# Patient Record
Sex: Female | Born: 1985 | Race: Black or African American | Hispanic: No | Smoking: Never smoker
Health system: Southern US, Community
[De-identification: ages and names within clinical notes are randomized; demographics above are authoritative.]

## PROBLEM LIST (undated history)

## (undated) DIAGNOSIS — E669 Obesity, unspecified: Secondary | ICD-10-CM

## (undated) DIAGNOSIS — C44792 Other specified malignant neoplasm of skin of right lower limb, including hip: Secondary | ICD-10-CM

---
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
## 2006-12-15 ENCOUNTER — Emergency Department (HOSPITAL_COMMUNITY): Admission: EM | Admit: 2006-12-15 | Discharge: 2006-12-15 | Payer: Self-pay | Admitting: Family Medicine

## 2009-09-07 ENCOUNTER — Emergency Department (HOSPITAL_COMMUNITY): Admission: EM | Admit: 2009-09-07 | Discharge: 2009-09-07 | Payer: Self-pay | Admitting: Emergency Medicine

## 2009-12-25 ENCOUNTER — Inpatient Hospital Stay (HOSPITAL_COMMUNITY): Admission: AD | Admit: 2009-12-25 | Discharge: 2009-12-25 | Payer: Self-pay | Admitting: Obstetrics & Gynecology

## 2010-05-01 ENCOUNTER — Ambulatory Visit (HOSPITAL_COMMUNITY): Admission: RE | Admit: 2010-05-01 | Discharge: 2010-05-01 | Payer: Self-pay | Admitting: Obstetrics and Gynecology

## 2010-08-21 ENCOUNTER — Inpatient Hospital Stay (HOSPITAL_COMMUNITY): Admission: AD | Admit: 2010-08-21 | Discharge: 2010-08-25 | Payer: Self-pay | Admitting: Obstetrics and Gynecology

## 2010-08-24 ENCOUNTER — Encounter (INDEPENDENT_AMBULATORY_CARE_PROVIDER_SITE_OTHER): Payer: Self-pay | Admitting: Obstetrics and Gynecology

## 2010-11-29 ENCOUNTER — Encounter: Payer: Self-pay | Admitting: Obstetrics and Gynecology

## 2011-01-20 LAB — RH IMMUNE GLOB WKUP(>/=20WKS)(NOT WOMEN'S HOSP)
Antibody Screen: NEGATIVE
Fetal Screen: NEGATIVE
Unit division: 0

## 2011-01-20 LAB — CBC
HCT: 31.9 % — ABNORMAL LOW (ref 36.0–46.0)
Hemoglobin: 10.6 g/dL — ABNORMAL LOW (ref 12.0–15.0)
Platelets: 290 10*3/uL (ref 150–400)
RBC: 3.6 MIL/uL — ABNORMAL LOW (ref 3.87–5.11)
WBC: 12.8 10*3/uL — ABNORMAL HIGH (ref 4.0–10.5)

## 2011-01-21 LAB — CBC
MCH: 29.1 pg (ref 26.0–34.0)
RBC: 4.4 MIL/uL (ref 3.87–5.11)
RDW: 14.5 % (ref 11.5–15.5)

## 2011-01-21 LAB — RPR: RPR Ser Ql: NONREACTIVE

## 2011-01-28 LAB — URINALYSIS, ROUTINE W REFLEX MICROSCOPIC
Bilirubin Urine: NEGATIVE
Glucose, UA: NEGATIVE mg/dL
Ketones, ur: NEGATIVE mg/dL
Nitrite: NEGATIVE

## 2011-01-28 LAB — WET PREP, GENITAL: Trich, Wet Prep: NONE SEEN

## 2011-01-28 LAB — CBC
HCT: 39.5 % (ref 36.0–46.0)
Platelets: 336 10*3/uL (ref 150–400)
RDW: 13.3 % (ref 11.5–15.5)
WBC: 6.8 10*3/uL (ref 4.0–10.5)

## 2011-01-28 LAB — POCT PREGNANCY, URINE: Preg Test, Ur: POSITIVE

## 2011-01-28 LAB — HCG, QUANTITATIVE, PREGNANCY: hCG, Beta Chain, Quant, S: 16403 m[IU]/mL — ABNORMAL HIGH (ref <5)

## 2011-01-28 LAB — GC/CHLAMYDIA PROBE AMP, GENITAL: GC Probe Amp, Genital: NEGATIVE

## 2011-02-11 LAB — URINALYSIS, ROUTINE W REFLEX MICROSCOPIC
Bilirubin Urine: NEGATIVE
Glucose, UA: NEGATIVE mg/dL
Hgb urine dipstick: NEGATIVE
Ketones, ur: NEGATIVE mg/dL

## 2011-04-12 ENCOUNTER — Encounter: Payer: Self-pay | Admitting: Family Medicine

## 2011-04-12 ENCOUNTER — Ambulatory Visit (INDEPENDENT_AMBULATORY_CARE_PROVIDER_SITE_OTHER): Payer: Medicaid Other | Admitting: Family Medicine

## 2011-04-12 VITALS — BP 108/73 | HR 81 | Temp 99.2°F | Ht 62.75 in | Wt 231.4 lb

## 2011-04-12 DIAGNOSIS — E669 Obesity, unspecified: Secondary | ICD-10-CM | POA: Insufficient documentation

## 2011-04-12 DIAGNOSIS — Z309 Encounter for contraceptive management, unspecified: Secondary | ICD-10-CM

## 2011-04-12 DIAGNOSIS — F1210 Cannabis abuse, uncomplicated: Secondary | ICD-10-CM | POA: Insufficient documentation

## 2011-04-12 DIAGNOSIS — Z30431 Encounter for routine checking of intrauterine contraceptive device: Secondary | ICD-10-CM | POA: Insufficient documentation

## 2011-04-12 DIAGNOSIS — Z79899 Other long term (current) drug therapy: Secondary | ICD-10-CM

## 2011-04-12 DIAGNOSIS — N898 Other specified noninflammatory disorders of vagina: Secondary | ICD-10-CM

## 2011-04-12 LAB — POCT WET PREP (WET MOUNT): Clue Cells Wet Prep HPF POC: NEGATIVE

## 2011-04-12 NOTE — Patient Instructions (Signed)
It was nice to meet you.  I will send you a letter in the mail with the rest of your lab results.  As long as they are all normal, you can follow up next November for your well woman exam and pap smear.  If you have any questions or problems, please feel free to call the office at any time.

## 2011-04-12 NOTE — Assessment & Plan Note (Signed)
Patient aware she is overweight but not motivated to make changes at this time.  Will continue to follow.

## 2011-04-12 NOTE — Progress Notes (Signed)
  Subjective:    Patient ID: Gail Thomas, female    DOB: 1986/07/15, 25 y.o.   MRN: [id number]  HPI Pt is a new patient to our practice.  She presents to establish care.  She wants to check and make sure her Mirena is still in place as she has stopped having periods.  She had it placed about 6 months ago and has heard it can stop your periods, but she wants to make sure she is not pregnant.  She has also been having a lot of white discharge and wants to be checked for STD's.  She says it does not smell bad but it is more than normal discharge.  She uses condoms with intercourse.   Patient's last Pap was in 2011, so was her last flu shot and tetanus shot.  She has one son who was born in October 2011 by primary caesarian section for failure to dilate.   Patient admits to smoking Marijuana 2-4 x/month.  She understands that it is illegal and detrimental to her health, which is why she does not do it that often.   Patient states she is not currently concerned with her weight.    Review of Systems  Constitutional: Negative for fever.  HENT: Negative for rhinorrhea.   Eyes: Negative for visual disturbance.  Respiratory: Negative for shortness of breath.   Cardiovascular: Negative for chest pain.  Gastrointestinal: Negative for abdominal pain.  Genitourinary: Positive for menstrual problem.  Musculoskeletal: Negative for arthralgias.  Skin: Negative for rash.  Neurological: Negative for dizziness.       Objective:   Physical Exam  Vitals reviewed. Constitutional: She appears well-developed and well-nourished. No distress.  Genitourinary: Uterus normal. Pelvic exam was performed with patient supine. Cervix exhibits discharge. Right adnexum displays no mass, no tenderness and no fullness. Left adnexum displays no mass, no tenderness and no fullness.       Mirena strings visible coming out of cervical os.  Copious white discharge coming from os and present in vagina.   Otherwise normal  exam.           Assessment & Plan:  25 year old female with amenorrhea, likely secondary to Mirena 1) Check urine pregnancy, Negative.  2) Check wet prep, GC/Chlamydia 3) Continue to use condoms with intercourse 4) Follow up next fall for Pap and well woman exam.

## 2011-04-12 NOTE — Assessment & Plan Note (Signed)
Mirena in place, Urine pregnancy negative.  Advised pt may be amenorrheic with IUD.

## 2011-04-12 NOTE — Assessment & Plan Note (Signed)
Pt smokes Marijuana two to four times/month.  Advised that this is unhealthy for multiple reasons, and advised pt not care for her child under the influence.

## 2011-04-13 ENCOUNTER — Encounter: Payer: Self-pay | Admitting: Family Medicine

## 2011-04-13 ENCOUNTER — Telehealth: Payer: Self-pay | Admitting: Family Medicine

## 2011-04-13 LAB — GC/CHLAMYDIA PROBE AMP, GENITAL: Chlamydia, DNA Probe: NEGATIVE

## 2011-04-13 MED ORDER — FLUCONAZOLE 150 MG PO TABS: 150.0000 mg | ORAL_TABLET | Freq: Once | ORAL | Status: AC

## 2011-04-13 NOTE — Telephone Encounter (Signed)
Called Gail Thomas to notify her she has a yeast infection and that I am sending an Rx to her pharmacy for diflucan.  Also told her all other tests (GC/Chlamydia, BV/Trich) were negative.

## 2011-06-03 ENCOUNTER — Emergency Department (HOSPITAL_COMMUNITY)
Admission: EM | Admit: 2011-06-03 | Discharge: 2011-06-03 | Disposition: A | Discharge status: Home / Self Care | Payer: Medicaid Other | Attending: Emergency Medicine | Admitting: Emergency Medicine

## 2011-06-03 DIAGNOSIS — R059 Cough, unspecified: Secondary | ICD-10-CM | POA: Insufficient documentation

## 2011-06-03 DIAGNOSIS — J029 Acute pharyngitis, unspecified: Secondary | ICD-10-CM | POA: Insufficient documentation

## 2011-06-03 DIAGNOSIS — B9789 Other viral agents as the cause of diseases classified elsewhere: Secondary | ICD-10-CM | POA: Insufficient documentation

## 2011-06-03 DIAGNOSIS — R509 Fever, unspecified: Secondary | ICD-10-CM | POA: Insufficient documentation

## 2011-06-03 DIAGNOSIS — R05 Cough: Secondary | ICD-10-CM | POA: Insufficient documentation

## 2011-10-14 IMAGING — US US OB COMP LESS 14 WK
1 series · 14 of 28 positions shown · non-contrast
Comparison: none

CLINICAL DATA: Early pregnancy with cramping.  Quantitative beta
HCG of [DATE].

OBSTETRIC <14 WK US AND TRANSVAGINAL OB US
TECHNIQUE: Both transabdominal and transvaginal ultrasound
examinations were performed for complete evaluation of the
gestation as well as the maternal uterus, adnexal regions, and
pelvic cul-de-sac.

[Series 1: us ob comp less 14 wks · 0.17mm/px · 60 acquisitions, 14 frames shown]
[im 3/60]
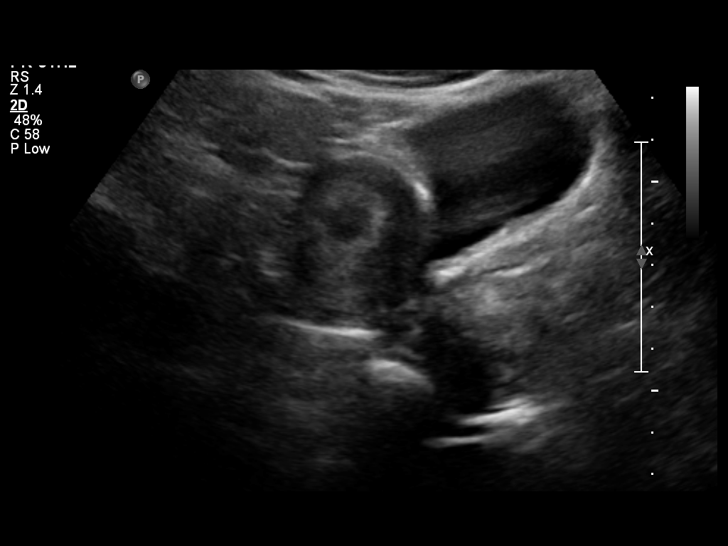
[im 7/60]
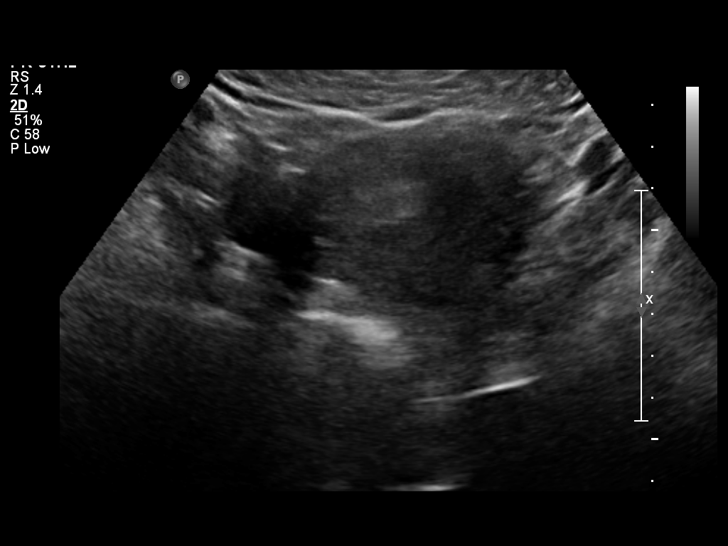
[im 11/60]
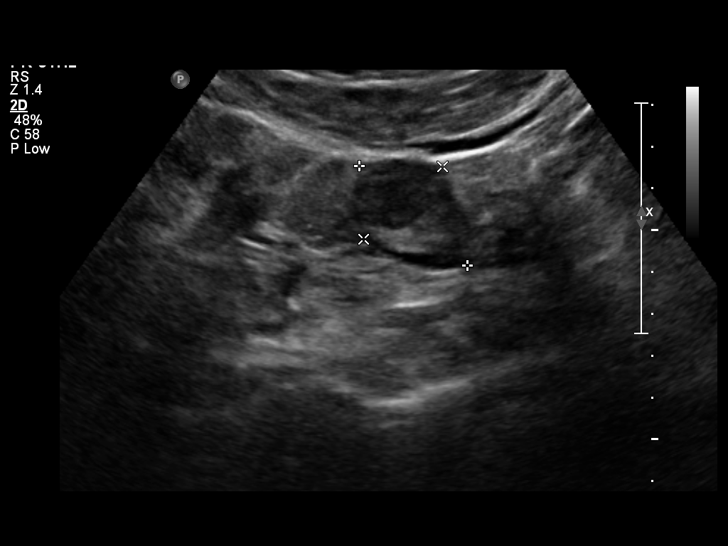
[im 16/60]
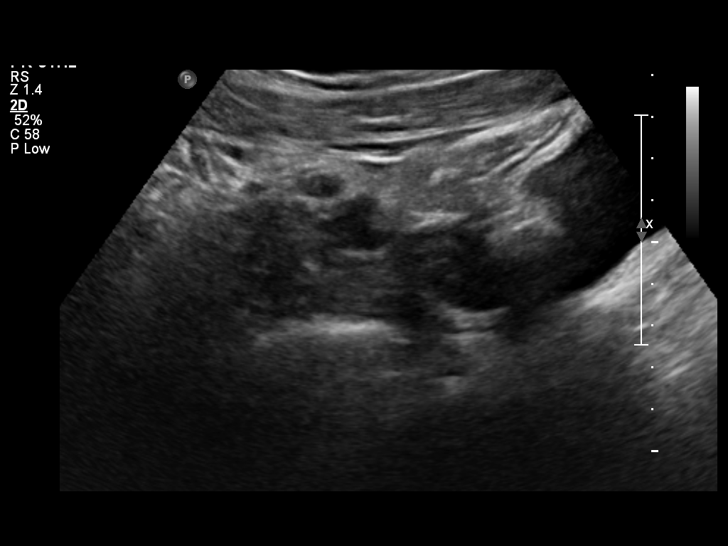
[im 20/60]
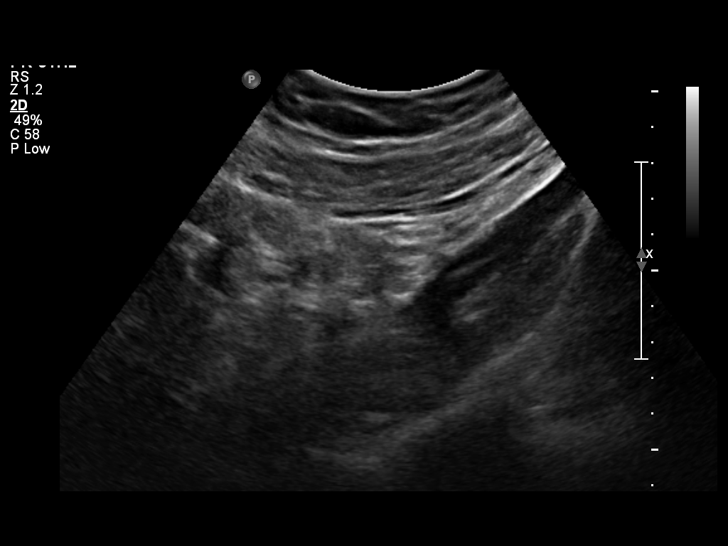
[im 25/60]
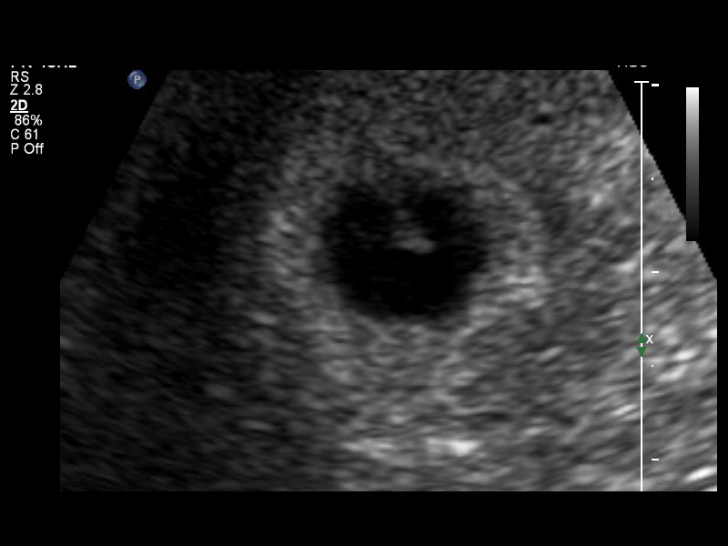
[im 29/60]
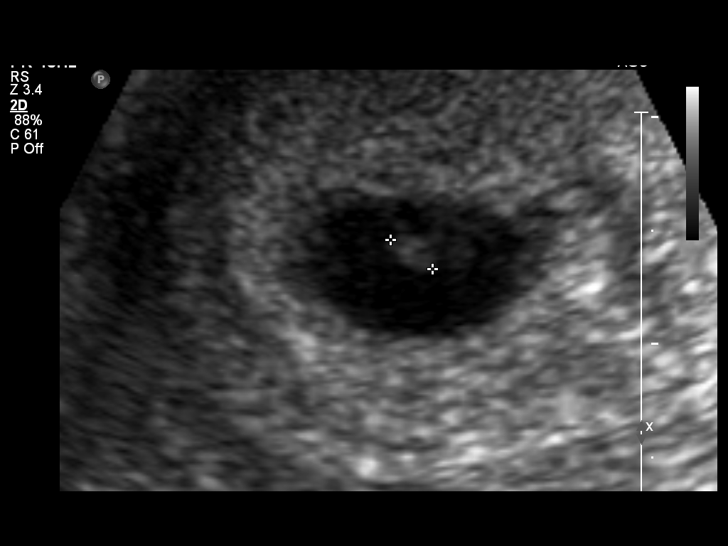
[im 33/60]
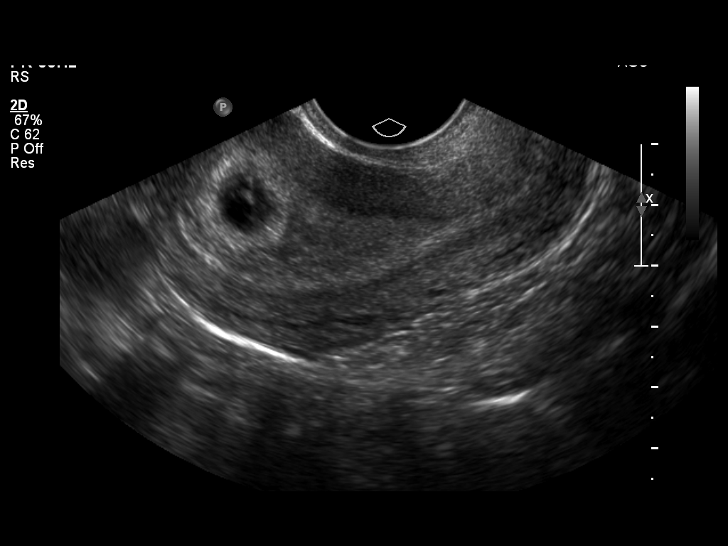
[im 38/60]
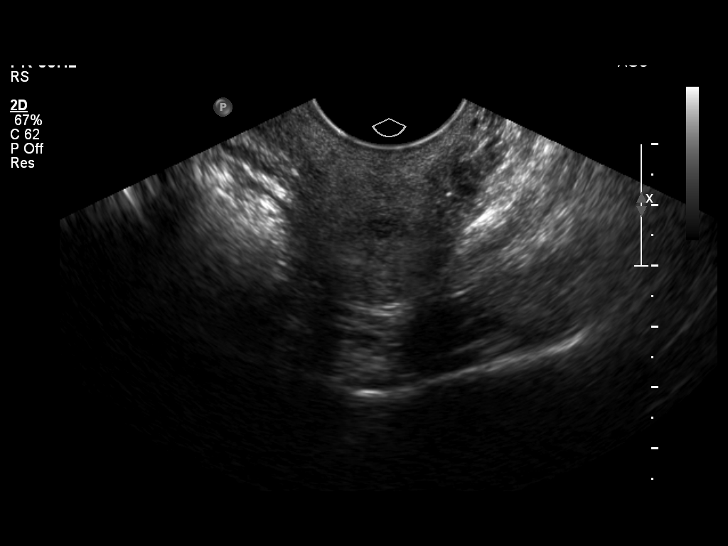
[im 42/60]
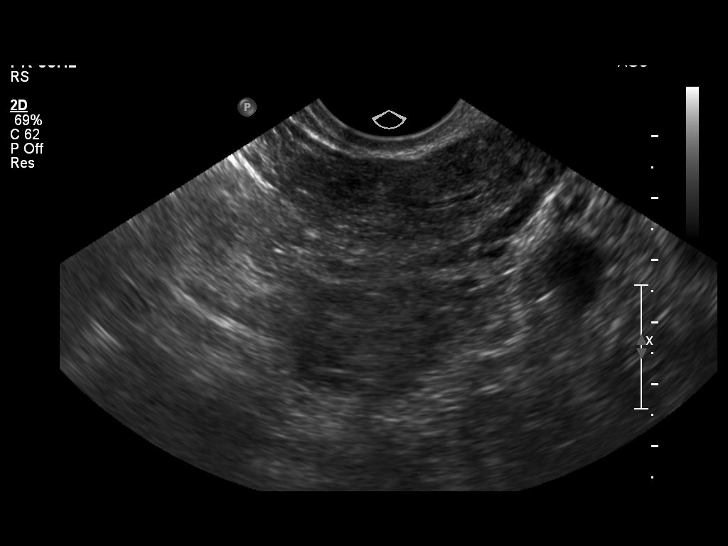
[im 46/60]
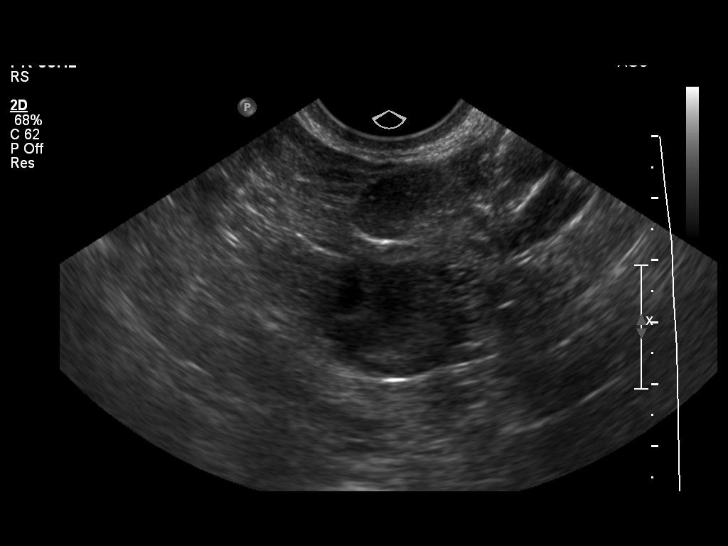
[im 51/60]
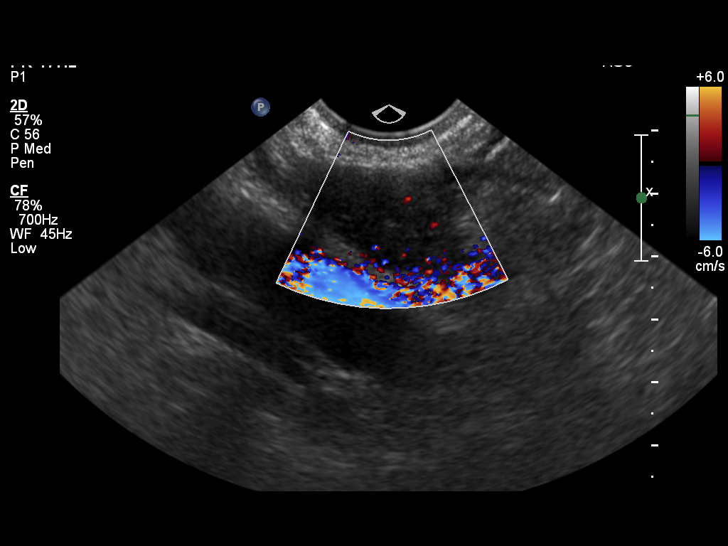
[im 55/60]
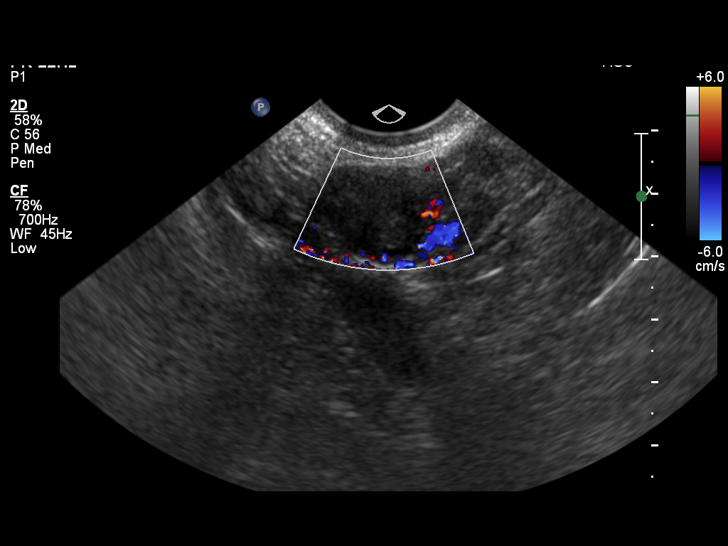
[im 60/60]
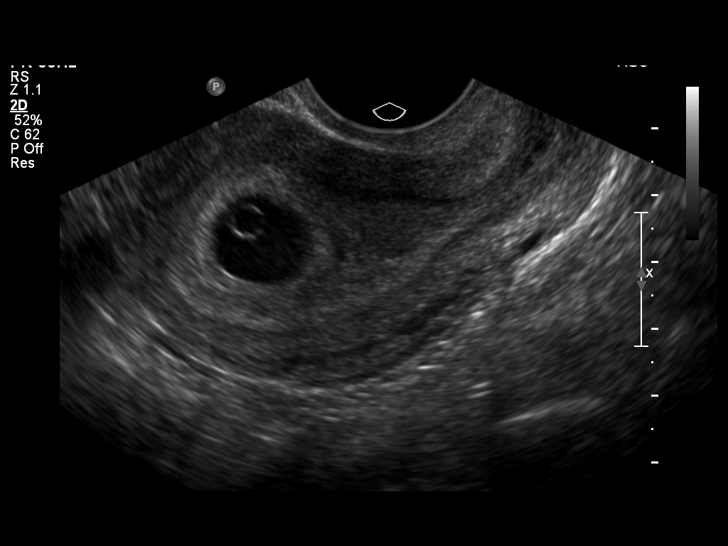

[14 of 28 positions shown; findings below may reference images not displayed]

FINDINGS: Intrauterine gestational sac noted with internal yolk sac
and fetal pole with cardiac activity at 102 beats per minute.
Crown-rump length is 0.24 cm compatible with 5 weeks 6 days
gestation.

No subchorionic hemorrhage identified.  The ovaries appear normal.
No free pelvic fluid identified.
IMPRESSION: 1.  Single living intrauterine pregnancy measuring at 5 weeks 6
days gestation, with embryonic cardiac activity at 102 beats per
minute.

## 2013-09-27 ENCOUNTER — Encounter: Payer: Self-pay | Admitting: Internal Medicine

## 2013-09-27 ENCOUNTER — Ambulatory Visit: Payer: Medicaid Other | Attending: Internal Medicine | Admitting: Internal Medicine

## 2013-09-27 VITALS — BP 120/80 | HR 79 | Temp 98.4°F | Ht 62.0 in | Wt 242.4 lb

## 2013-09-27 DIAGNOSIS — Z01419 Encounter for gynecological examination (general) (routine) without abnormal findings: Secondary | ICD-10-CM | POA: Insufficient documentation

## 2013-09-27 DIAGNOSIS — Z124 Encounter for screening for malignant neoplasm of cervix: Secondary | ICD-10-CM

## 2013-09-27 DIAGNOSIS — Z0000 Encounter for general adult medical examination without abnormal findings: Secondary | ICD-10-CM | POA: Insufficient documentation

## 2013-09-27 NOTE — Progress Notes (Signed)
Patient ID: Gail Thomas, female   DOB: 09/30/1986, 27 y.o.   MRN: [id number] Patient Demographics  Gail Thomas, is a 27 y.o. female  NWG:[id number]  [id number]:[id number]  DOB - June 26, 1986  CC:  Chief Complaint  Patient presents with  . new patient    exam for cervical cancer       HPI: Gail Thomas is a 27 y.o. female here today to establish medical care. Patient started college for [employer], needs tetanus and flu shot, TB test and varicella titer. She has no complaint, she also wants to have a Pap smear done. She has had abnormal Pap before 3 years ago, status post colposcopy. She smokes marijuana daily, drinks alcohol occasionally. Patient has No headache, No chest pain, No abdominal pain - No Nausea, No new weakness tingling or numbness, No Cough - SOB.  No Known Allergies History reviewed. No pertinent past medical history. Current Outpatient Prescriptions on File Prior to Visit  Medication Sig Dispense Refill  . levonorgestrel (MIRENA) 20 MCG/24HR IUD 1 each by Intrauterine route once.         No current facility-administered medications on file prior to visit.   Family History  Problem Relation Age of Onset  . Diabetes Maternal Grandmother   . Cancer Maternal Grandmother    History   Social History  . Marital Status: Single    Spouse Name: N/A    Number of Children: N/A  . Years of Education: N/A   Occupational History  . Not on file.   Social History Main Topics  . Smoking status: Current Some Day Smoker    Types: Cigarettes  . Smokeless tobacco: Never Used  . Alcohol Use: Not on file  . Drug Use: 1.00 per week    Special: Marijuana  . Sexual Activity: Yes    Birth Control/ Protection: IUD, Condom   Other Topics Concern  . Not on file   Social History Narrative  . No narrative on file    Review of Systems: Constitutional: Negative for fever, chills, diaphoresis, activity change, appetite change and fatigue. HENT: Negative for ear pain, nosebleeds,  congestion, facial swelling, rhinorrhea, neck pain, neck stiffness and ear discharge.  Eyes: Negative for pain, discharge, redness, itching and visual disturbance. Respiratory: Negative for cough, choking, chest tightness, shortness of breath, wheezing and stridor.  Cardiovascular: Negative for chest pain, palpitations and leg swelling. Gastrointestinal: Negative for abdominal distention. Genitourinary: Negative for dysuria, urgency, frequency, hematuria, flank pain, decreased urine volume, difficulty urinating and dyspareunia.  Musculoskeletal: Negative for back pain, joint swelling, arthralgia and gait problem. Neurological: Negative for dizziness, tremors, seizures, syncope, facial asymmetry, speech difficulty, weakness, light-headedness, numbness and headaches.  Hematological: Negative for adenopathy. Does not bruise/bleed easily. Psychiatric/Behavioral: Negative for hallucinations, behavioral problems, confusion, dysphoric mood, decreased concentration and agitation.    Objective:   Filed Vitals:   09/27/13 1513  BP: 120/80  Pulse: 79  Temp: 98.4 F (36.9 C)    Physical Exam: Constitutional: Patient appears well-developed and well-nourished. No distress. Morbidly obese HENT: Normocephalic, atraumatic, External right and left ear normal. Oropharynx is clear and moist.  Eyes: Conjunctivae and EOM are normal. PERRLA, no scleral icterus. Neck: Normal ROM. Neck supple. No JVD. No tracheal deviation. No thyromegaly. CVS: RRR, S1/S2 +, no murmurs, no gallops, no carotid bruit.  Pulmonary: Effort and breath sounds normal, no stridor, rhonchi, wheezes, rales.  Abdominal: Soft. BS +, no distension, tenderness, rebound or guarding.  Musculoskeletal: Normal range of motion. No edema and no tenderness.  Lymphadenopathy: No lymphadenopathy noted, cervical, inguinal or axillary Neuro: Alert. Normal reflexes, muscle tone coordination. No cranial nerve deficit. Skin: Skin is warm and dry. No  rash noted. Not diaphoretic. No erythema. No pallor. Psychiatric: Normal mood and affect. Behavior, judgment, thought content normal. Pelvic exam: Normal female external genitalia, copious whitish/milky discharge, central cervix, IUD thread in situ. Negative cervical excitation tenderness Lab Results  Component Value Date   WBC 12.8* 08/24/2010   HGB 10.6* 08/24/2010   HCT 31.9* 08/24/2010   MCV 88.6 08/24/2010   PLT 290 08/24/2010   No results found for this basename: CREATININE, BUN, NA, K, CL, CO2    No results found for this basename: HGBA1C   Lipid Panel  No results found for this basename: chol, trig, hdl, cholhdl, vldl, ldlcalc       Assessment and plan:   1. Annual physical exam Patient needs to have serum Varicella titer for school-   Varicella zoster antibody, IgG  2. Pap smear for cervical cancer screening - Cytology - PAP [place] - Cervicovaginal ancillary only  Patient extensively counseled about nutrition and exercise Patient counseled about smoking cessation  Follow up in 6 months or when necessary   The patient was given clear instructions to go to ER or return to medical center if symptoms don't improve, worsen or new problems develop. The patient verbalized understanding. The patient was told to call to get lab results if they haven't heard anything in the next week.     Jeanann Lewandowsky, MD, MHA, FACP, FAAP [facility] [place], [place] [phone]   09/27/2013, 3:52 PM

## 2013-09-27 NOTE — Patient Instructions (Signed)
Exercise to Lose Weight Exercise and a healthy diet may help you lose weight. Your doctor may suggest specific exercises. EXERCISE IDEAS AND TIPS  Choose low-cost things you enjoy doing, such as walking, bicycling, or exercising to workout videos.  Take stairs instead of the elevator.  Walk during your lunch break.  Park your car further away from work or school.  Go to a gym or an exercise class.  Start with 5 to 10 minutes of exercise each day. Build up to 30 minutes of exercise 4 to 6 days a week.  Wear shoes with good support and comfortable clothes.  Stretch before and after working out.  Work out until you breathe harder and your heart beats faster.  Drink extra water when you exercise.  Do not do so much that you hurt yourself, feel dizzy, or get very short of breath. Exercises that burn about 150 calories:  Running 1  miles in 15 minutes.  Playing volleyball for 45 to 60 minutes.  Washing and waxing a car for 45 to 60 minutes.  Playing touch football for 45 minutes.  Walking 1  miles in 35 minutes.  Pushing a stroller 1  miles in 30 minutes.  Playing basketball for 30 minutes.  Raking leaves for 30 minutes.  Bicycling 5 miles in 30 minutes.  Walking 2 miles in 30 minutes.  Dancing for 30 minutes.  Shoveling snow for 15 minutes.  Swimming laps for 20 minutes.  Walking up stairs for 15 minutes.  Bicycling 4 miles in 15 minutes.  Gardening for 30 to 45 minutes.  Jumping rope for 15 minutes.  Washing windows or floors for 45 to 60 minutes. Document Released: 11/27/2010 Document Revised: 01/17/2012 Document Reviewed: 11/27/2010 Exit[employer] Patient Information 2014 [employer]. Calorie Counting Diet A calorie counting diet requires you to eat the number of calories that are right for you in a day. Calories are the measurement of how much energy you get from the food you eat. Eating the right amount of calories is important for staying at a  healthy weight. If you eat too many calories, your body will store them as fat and you may gain weight. If you eat too few calories, you may lose weight. Counting the number of calories you eat during a day will help you know if you are eating the right amount. A Registered Dietitian can determine how many calories you need in a day. The amount of calories needed varies from person to person. If your goal is to lose weight, you will need to eat fewer calories. Losing weight can benefit you if you are overweight or have health problems such as heart disease, high blood pressure, or diabetes. If your goal is to gain weight, you will need to eat more calories. Gaining weight may be necessary if you have a certain health problem that causes your body to need more energy. TIPS Whether you are increasing or decreasing the number of calories you eat during a day, it may be hard to get used to changes in what you eat and drink. The following are tips to help you keep track of the number of calories you eat.  Measure foods at home with measuring cups. This helps you know the amount of food and number of calories you are eating.  Restaurants often serve food in amounts that are larger than 1 serving. While eating out, estimate how many servings of a food you are given. For example, a serving of cooked rice   is  cup or about the size of half of a fist. Knowing serving sizes will help you be aware of how much food you are eating at restaurants.  Ask for smaller portion sizes or child-size portions at restaurants.  Plan to eat half of a meal at a restaurant. Take the rest home or share the other half with a friend.  Read the Nutrition Facts panel on food labels for calorie content and serving size. You can find out how many servings are in a package, the size of a serving, and the number of calories each serving has.  For example, a package might contain 3 cookies. The Nutrition Facts panel on that package says  that 1 serving is 1 cookie. Below that, it will say there are 3 servings in the container. The calories section of the Nutrition Facts label says there are 90 calories. This means there are 90 calories in 1 cookie (1 serving). If you eat 1 cookie you have eaten 90 calories. If you eat all 3 cookies, you have eaten 270 calories (3 servings x 90 calories = 270 calories). The list below tells you how big or small some common portion sizes are.  1 oz.........4 stacked dice.  3 oz.........Deck of cards.  1 tsp........Tip of little finger.  1 tbs........Thumb.  2 tbs........Golf ball.   cup.......Half of a fist.  1 cup........A fist. KEEP A FOOD LOG Write down every food item you eat, the amount you eat, and the number of calories in each food you eat during the day. At the end of the day, you can add up the total number of calories you have eaten. It may help to keep a list like the one below. Find out the calorie information by reading the Nutrition Facts panel on food labels. Breakfast  Bran cereal (1 cup, 110 calories).  Fat-free milk ( cup, 45 calories). Snack  Apple (1 medium, 80 calories). Lunch  Spinach (1 cup, 20 calories).  Tomato ( medium, 20 calories).  Chicken breast strips (3 oz, 165 calories).  Shredded cheddar cheese ( cup, 110 calories).  Light Italian dressing (2 tbs, 60 calories).  Whole-wheat bread (1 slice, 80 calories).  Tub margarine (1 tsp, 35 calories).  Vegetable soup (1 cup, 160 calories). Dinner  Pork chop (3 oz, 190 calories).  Brown rice (1 cup, 215 calories).  Steamed broccoli ( cup, 20 calories).  Strawberries (1  cup, 65 calories).  Whipped cream (1 tbs, 50 calories). Daily Calorie Total: 1425 Document Released: 10/25/2005 Document Revised: 01/17/2012 Document Reviewed: 04/21/2007 Exit[employer] Patient Information 2014 [employer].  

## 2013-09-27 NOTE — Progress Notes (Signed)
Pt is her to establish care. Pt request an exam for cervical cancer; also has a list of shots requested for school.

## 2013-09-28 LAB — VARICELLA ZOSTER ANTIBODY, IGG: Varicella IgG: 206.1 Index — ABNORMAL HIGH (ref <135.00)

## 2015-09-16 ENCOUNTER — Other Ambulatory Visit: Payer: Self-pay | Admitting: Obstetrics and Gynecology

## 2015-09-17 LAB — CYTOLOGY - PAP

## 2015-10-21 DIAGNOSIS — D2370 Other benign neoplasm of skin of unspecified lower limb, including hip: Secondary | ICD-10-CM | POA: Insufficient documentation

## 2015-12-02 DIAGNOSIS — C44701 Unspecified malignant neoplasm of skin of unspecified lower limb, including hip: Secondary | ICD-10-CM | POA: Insufficient documentation

## 2015-12-31 ENCOUNTER — Encounter (HOSPITAL_BASED_OUTPATIENT_CLINIC_OR_DEPARTMENT_OTHER): Payer: Self-pay | Admitting: *Deleted

## 2016-01-03 ENCOUNTER — Other Ambulatory Visit: Payer: Self-pay | Admitting: Plastic Surgery

## 2016-01-03 DIAGNOSIS — C4499 Other specified malignant neoplasm of skin, unspecified: Secondary | ICD-10-CM

## 2016-01-07 ENCOUNTER — Encounter (HOSPITAL_BASED_OUTPATIENT_CLINIC_OR_DEPARTMENT_OTHER)
Admission: RE | Disposition: A | Discharge status: Home / Self Care | Payer: Self-pay | Source: Ambulatory Visit | Attending: Plastic Surgery

## 2016-01-07 ENCOUNTER — Encounter (HOSPITAL_BASED_OUTPATIENT_CLINIC_OR_DEPARTMENT_OTHER): Payer: Self-pay | Admitting: *Deleted

## 2016-01-07 ENCOUNTER — Ambulatory Visit (HOSPITAL_BASED_OUTPATIENT_CLINIC_OR_DEPARTMENT_OTHER): Payer: 59 | Admitting: Anesthesiology

## 2016-01-07 ENCOUNTER — Ambulatory Visit (HOSPITAL_BASED_OUTPATIENT_CLINIC_OR_DEPARTMENT_OTHER)
Admission: RE | Admit: 2016-01-07 | Discharge: 2016-01-07 | Disposition: A | Discharge status: Home / Self Care | Payer: 59 | Source: Ambulatory Visit | Attending: Plastic Surgery | Admitting: Plastic Surgery

## 2016-01-07 DIAGNOSIS — Z87891 Personal history of nicotine dependence: Secondary | ICD-10-CM | POA: Diagnosis not present

## 2016-01-07 DIAGNOSIS — C44792 Other specified malignant neoplasm of skin of right lower limb, including hip: Secondary | ICD-10-CM | POA: Diagnosis present

## 2016-01-07 DIAGNOSIS — Z6841 Body Mass Index (BMI) 40.0 and over, adult: Secondary | ICD-10-CM | POA: Insufficient documentation

## 2016-01-07 DIAGNOSIS — C4499 Other specified malignant neoplasm of skin, unspecified: Secondary | ICD-10-CM

## 2016-01-07 HISTORY — DX: Other specified malignant neoplasm of skin of right lower limb, including hip: C44.792

## 2016-01-07 HISTORY — DX: Obesity, unspecified: E66.9

## 2016-01-07 HISTORY — PX: LESION REMOVAL: SHX5196

## 2016-01-07 SURGERY — WIDE EXCISION, LESION, UPPER EXTREMITY
Anesthesia: General | Laterality: Right

## 2016-01-07 MED ORDER — SUCCINYLCHOLINE CHLORIDE 20 MG/ML IJ SOLN
INTRAMUSCULAR | Status: AC
  Filled 2016-01-07: qty 1

## 2016-01-07 MED ORDER — FENTANYL CITRATE (PF) 100 MCG/2ML IJ SOLN
INTRAMUSCULAR | Status: AC
  Filled 2016-01-07: qty 2

## 2016-01-07 MED ORDER — FENTANYL CITRATE (PF) 100 MCG/2ML IJ SOLN: 25.0000 ug | INTRAMUSCULAR | Status: DC | PRN

## 2016-01-07 MED ORDER — ONDANSETRON HCL 4 MG/2ML IJ SOLN
INTRAMUSCULAR | Status: AC
  Filled 2016-01-07: qty 2

## 2016-01-07 MED ORDER — CEFAZOLIN SODIUM-DEXTROSE 2-3 GM-% IV SOLR
2.0000 g | INTRAVENOUS | Status: AC
  Administered 2016-01-07: 2 g via INTRAVENOUS

## 2016-01-07 MED ORDER — SODIUM CHLORIDE 0.9 % IJ SOLN
INTRAMUSCULAR | Status: AC
  Filled 2016-01-07: qty 20

## 2016-01-07 MED ORDER — DEXAMETHASONE SODIUM PHOSPHATE 10 MG/ML IJ SOLN
INTRAMUSCULAR | Status: AC
  Filled 2016-01-07: qty 1

## 2016-01-07 MED ORDER — PROMETHAZINE HCL 25 MG/ML IJ SOLN: 6.2500 mg | INTRAMUSCULAR | Status: DC | PRN

## 2016-01-07 MED ORDER — ONDANSETRON HCL 4 MG/2ML IJ SOLN
INTRAMUSCULAR | Status: AC
Start: 2016-01-07 — End: 2016-01-07
  Filled 2016-01-07: qty 2

## 2016-01-07 MED ORDER — EPHEDRINE SULFATE 50 MG/ML IJ SOLN
INTRAMUSCULAR | Status: AC
  Filled 2016-01-07: qty 1

## 2016-01-07 MED ORDER — LIDOCAINE HCL (CARDIAC) 20 MG/ML IV SOLN
INTRAVENOUS | Status: AC
  Filled 2016-01-07: qty 5

## 2016-01-07 MED ORDER — BUPIVACAINE-EPINEPHRINE 0.25% -1:200000 IJ SOLN
INTRAMUSCULAR | Status: DC | PRN
  Administered 2016-01-07: 20 mL

## 2016-01-07 MED ORDER — LIDOCAINE HCL (CARDIAC) 20 MG/ML IV SOLN
INTRAVENOUS | Status: DC | PRN
  Administered 2016-01-07: 50 mg via INTRAVENOUS

## 2016-01-07 MED ORDER — CEFAZOLIN SODIUM-DEXTROSE 2-3 GM-% IV SOLR
INTRAVENOUS | Status: AC
  Filled 2016-01-07: qty 50

## 2016-01-07 MED ORDER — MIDAZOLAM HCL 2 MG/2ML IJ SOLN
1.0000 mg | INTRAMUSCULAR | Status: DC | PRN
  Administered 2016-01-07: 2 mg via INTRAVENOUS

## 2016-01-07 MED ORDER — DEXAMETHASONE SODIUM PHOSPHATE 4 MG/ML IJ SOLN
INTRAMUSCULAR | Status: DC | PRN
  Administered 2016-01-07: 10 mg via INTRAVENOUS

## 2016-01-07 MED ORDER — FENTANYL CITRATE (PF) 100 MCG/2ML IJ SOLN
50.0000 ug | INTRAMUSCULAR | Status: DC | PRN
  Administered 2016-01-07: 100 ug via INTRAVENOUS

## 2016-01-07 MED ORDER — ONDANSETRON HCL 4 MG/2ML IJ SOLN
INTRAMUSCULAR | Status: DC | PRN
  Administered 2016-01-07: 4 mg via INTRAVENOUS

## 2016-01-07 MED ORDER — BUPIVACAINE HCL (PF) 0.25 % IJ SOLN: INTRAMUSCULAR | Status: DC | PRN

## 2016-01-07 MED ORDER — SUFENTANIL CITRATE 50 MCG/ML IV SOLN
INTRAVENOUS | Status: AC
  Filled 2016-01-07: qty 1

## 2016-01-07 MED ORDER — BACITRACIN-NEOMYCIN-POLYMYXIN 400-5-5000 EX OINT
TOPICAL_OINTMENT | CUTANEOUS | Status: AC
  Filled 2016-01-07: qty 1

## 2016-01-07 MED ORDER — PROPOFOL 10 MG/ML IV BOLUS
INTRAVENOUS | Status: DC | PRN
  Administered 2016-01-07: 200 mg via INTRAVENOUS

## 2016-01-07 MED ORDER — SCOPOLAMINE 1 MG/3DAYS TD PT72: 1.0000 | MEDICATED_PATCH | Freq: Once | TRANSDERMAL | Status: DC | PRN

## 2016-01-07 MED ORDER — GLYCOPYRROLATE 0.2 MG/ML IJ SOLN: 0.2000 mg | Freq: Once | INTRAMUSCULAR | Status: DC | PRN

## 2016-01-07 MED ORDER — PROPOFOL 10 MG/ML IV BOLUS
INTRAVENOUS | Status: AC
  Filled 2016-01-07: qty 20

## 2016-01-07 MED ORDER — LACTATED RINGERS IV SOLN
INTRAVENOUS | Status: DC
  Administered 2016-01-07: 12:00:00 via INTRAVENOUS

## 2016-01-07 MED ORDER — SUCCINYLCHOLINE CHLORIDE 20 MG/ML IJ SOLN
INTRAMUSCULAR | Status: DC | PRN
  Administered 2016-01-07: 100 mg via INTRAVENOUS

## 2016-01-07 MED ORDER — SODIUM CHLORIDE 0.9 % IJ SOLN
INTRAMUSCULAR | Status: DC | PRN
  Administered 2016-01-07: 20 mL via INTRAVENOUS

## 2016-01-07 MED ORDER — MIDAZOLAM HCL 2 MG/2ML IJ SOLN
INTRAMUSCULAR | Status: AC
  Filled 2016-01-07: qty 2

## 2016-01-07 MED ORDER — SODIUM CHLORIDE 0.9 % IN NEBU
INHALATION_SOLUTION | RESPIRATORY_TRACT | Status: AC
  Filled 2016-01-07: qty 3

## 2016-01-07 SURGICAL SUPPLY — 74 items
BAG DECANTER FOR FLEXI CONT (MISCELLANEOUS) ×3 IMPLANT
BANDAGE ACE 3X5.8 VEL STRL LF (GAUZE/BANDAGES/DRESSINGS) IMPLANT
BANDAGE ACE 4X5 VEL STRL LF (GAUZE/BANDAGES/DRESSINGS) ×3 IMPLANT
BANDAGE ACE 6X5 VEL STRL LF (GAUZE/BANDAGES/DRESSINGS) IMPLANT
BLADE HEX COATED 2.75 (ELECTRODE) ×3 IMPLANT
BLADE SURG 10 STRL SS (BLADE) ×3 IMPLANT
BLADE SURG 15 STRL LF DISP TIS (BLADE) IMPLANT
BLADE SURG 15 STRL SS (BLADE)
BNDG COHESIVE 4X5 TAN STRL (GAUZE/BANDAGES/DRESSINGS) IMPLANT
BNDG CONFORM 2 STRL LF (GAUZE/BANDAGES/DRESSINGS) IMPLANT
BNDG CONFORM 3 STRL LF (GAUZE/BANDAGES/DRESSINGS) IMPLANT
BNDG GAUZE ELAST 4 BULKY (GAUZE/BANDAGES/DRESSINGS) ×3 IMPLANT
CANISTER SUCT 1200ML W/VALVE (MISCELLANEOUS) ×3 IMPLANT
CHLORAPREP W/TINT 26ML (MISCELLANEOUS) IMPLANT
CLOSURE WOUND 1/2 X4 (GAUZE/BANDAGES/DRESSINGS)
COVER BACK TABLE 60X90IN (DRAPES) ×3 IMPLANT
DECANTER SPIKE VIAL GLASS SM (MISCELLANEOUS) IMPLANT
DERMABOND ADVANCED (GAUZE/BANDAGES/DRESSINGS)
DERMABOND ADVANCED .7 DNX12 (GAUZE/BANDAGES/DRESSINGS) IMPLANT
DRAPE INCISE IOBAN 66X45 STRL (DRAPES) IMPLANT
DRAPE U-SHAPE 76X120 STRL (DRAPES) ×3 IMPLANT
DRESSING DUODERM 4X4 STERILE (GAUZE/BANDAGES/DRESSINGS) ×3 IMPLANT
DRSG ADAPTIC 3X8 NADH LF (GAUZE/BANDAGES/DRESSINGS) IMPLANT
DRSG EMULSION OIL 3X3 NADH (GAUZE/BANDAGES/DRESSINGS) IMPLANT
DRSG PAD ABDOMINAL 8X10 ST (GAUZE/BANDAGES/DRESSINGS) IMPLANT
ELECT REM PT RETURN 9FT ADLT (ELECTROSURGICAL) ×3
ELECTRODE REM PT RTRN 9FT ADLT (ELECTROSURGICAL) ×1 IMPLANT
GAUZE SPONGE 4X4 12PLY STRL (GAUZE/BANDAGES/DRESSINGS) ×3 IMPLANT
GAUZE XEROFORM 1X8 LF (GAUZE/BANDAGES/DRESSINGS) ×3 IMPLANT
GAUZE XEROFORM 5X9 LF (GAUZE/BANDAGES/DRESSINGS) IMPLANT
GLOVE BIO SURGEON STRL SZ 6.5 (GLOVE) ×6 IMPLANT
GLOVE BIO SURGEON STRL SZ8 (GLOVE) IMPLANT
GLOVE BIO SURGEONS STRL SZ 6.5 (GLOVE) ×3
GLOVE BIOGEL PI IND STRL 7.0 (GLOVE) ×2 IMPLANT
GLOVE BIOGEL PI INDICATOR 7.0 (GLOVE) ×4
GLOVE ECLIPSE 6.5 STRL STRAW (GLOVE) ×3 IMPLANT
GOWN STRL REUS W/ TWL LRG LVL3 (GOWN DISPOSABLE) ×3 IMPLANT
GOWN STRL REUS W/TWL LRG LVL3 (GOWN DISPOSABLE) ×6
MANIFOLD NEPTUNE II (INSTRUMENTS) IMPLANT
NEEDLE HYPO 25X1 1.5 SAFETY (NEEDLE) ×3 IMPLANT
NS IRRIG 1000ML POUR BTL (IV SOLUTION) IMPLANT
PACK BASIN DAY SURGERY FS (CUSTOM PROCEDURE TRAY) ×3 IMPLANT
PADDING CAST ABS 3INX4YD NS (CAST SUPPLIES)
PADDING CAST ABS 4INX4YD NS (CAST SUPPLIES)
PADDING CAST ABS COTTON 3X4 (CAST SUPPLIES) IMPLANT
PADDING CAST ABS COTTON 4X4 ST (CAST SUPPLIES) IMPLANT
PENCIL BUTTON HOLSTER BLD 10FT (ELECTRODE) ×3 IMPLANT
SHEET MEDIUM DRAPE 40X70 STRL (DRAPES) ×3 IMPLANT
SLEEVE SCD COMPRESS KNEE MED (MISCELLANEOUS) IMPLANT
SPLINT FIBERGLASS 3X35 (CAST SUPPLIES) IMPLANT
SPLINT FIBERGLASS 4X30 (CAST SUPPLIES) IMPLANT
SPLINT PLASTER CAST XFAST 3X15 (CAST SUPPLIES) IMPLANT
SPLINT PLASTER XTRA FASTSET 3X (CAST SUPPLIES)
SPONGE GAUZE 4X4 12PLY STER LF (GAUZE/BANDAGES/DRESSINGS) IMPLANT
SPONGE LAP 18X18 X RAY DECT (DISPOSABLE) ×3 IMPLANT
STAPLER VISISTAT 35W (STAPLE) IMPLANT
STOCKINETTE IMPERVIOUS LG (DRAPES) IMPLANT
STRIP CLOSURE SKIN 1/2X4 (GAUZE/BANDAGES/DRESSINGS) IMPLANT
SURGILUBE 2OZ TUBE FLIPTOP (MISCELLANEOUS) IMPLANT
SUT MNCRL AB 3-0 PS2 18 (SUTURE) ×6 IMPLANT
SUT MON AB 5-0 PS2 18 (SUTURE) ×6 IMPLANT
SUT SILK 3 0 PS 1 (SUTURE) IMPLANT
SUT SILK 4 0 PS 2 (SUTURE) ×3 IMPLANT
SUT VIC AB 5-0 PS2 18 (SUTURE) ×3 IMPLANT
SYR BULB IRRIGATION 50ML (SYRINGE) ×3 IMPLANT
SYR CONTROL 10ML LL (SYRINGE) IMPLANT
TAPE HYPAFIX 6 X30' (GAUZE/BANDAGES/DRESSINGS)
TAPE HYPAFIX 6X30 (GAUZE/BANDAGES/DRESSINGS) IMPLANT
TOWEL OR 17X24 6PK STRL BLUE (TOWEL DISPOSABLE) ×6 IMPLANT
TRAY DSU PREP LF (CUSTOM PROCEDURE TRAY) ×3 IMPLANT
TUBE CONNECTING 20'X1/4 (TUBING) ×1
TUBE CONNECTING 20X1/4 (TUBING) ×2 IMPLANT
UNDERPAD 30X30 (UNDERPADS AND DIAPERS) ×3 IMPLANT
YANKAUER SUCT BULB TIP NO VENT (SUCTIONS) ×3 IMPLANT

## 2016-01-07 NOTE — Transfer of Care (Signed)
Immediate Anesthesia Transfer of Care Note  Patient: Gail Thomas  Procedure(s) Performed: Procedure(s): RE EXCISION OF RIGHT LEG DERMATOFIBROSARCOMA  (Right)  Patient Location: PACU  Anesthesia Type:General  Level of Consciousness: awake  Airway & Oxygen Therapy: Patient Spontanous Breathing and Patient connected to face mask oxygen  Post-op Assessment: Report given to RN and Post -op Vital signs reviewed and stable  Post vital signs: Reviewed and stable  Last Vitals:  Filed Vitals:   01/07/16 1156  BP: 109/55  Pulse: 64  Temp: 36.7 C  Resp: 16    Complications: No apparent anesthesia complications

## 2016-01-07 NOTE — Brief Op Note (Signed)
01/07/2016  3:11 PM  PATIENT:  Gail Thomas  30 y.o. female  PRE-OPERATIVE DIAGNOSIS:  RIGHT LEG DERMATOFIBROSARCOMA   POST-OPERATIVE DIAGNOSIS:  same  PROCEDURE:  Procedure(s): IRRIGATION AND DEBRIDEMENT OF RIGHT LEG DERMATOFIBROSARCOMA  (Right)  SURGEON:  Surgeon(s) and Role:    * Kiaira Pointer S Romani Wilbon, DO - Primary  PHYSICIAN ASSISTANT: Shawn Rayburn, PA  ASSISTANTS: none   ANESTHESIA:   general  EBL:  Total I/O In: 1500 [I.V.:1500] Out: -   BLOOD ADMINISTERED:none  DRAINS: none   LOCAL MEDICATIONS USED:  MARCAINE     SPECIMEN:  Source of Specimen:  dermatofibrosarcoma right leg  DISPOSITION OF SPECIMEN:  PATHOLOGY  COUNTS:  YES  TOURNIQUET:  * No tourniquets in log *  DICTATION: .Dragon Dictation  PLAN OF CARE: Discharge to home after PACU  PATIENT DISPOSITION:  PACU - hemodynamically stable.   Delay start of Pharmacological VTE agent (>24hrs) due to surgical blood loss or risk of bleeding: no

## 2016-01-07 NOTE — H&P (Signed)
Gail Thomas is an 30 y.o. female.   Chief Complaint: dermatofibrosarcoma HPI: The patient is a 30 yrs old bf here for treatment of a lesion on her leg.  She had initial excision at the office and the path showed a dermatofibrosarcoma.  The path indicated there were positive margins.  She has done very well from the excision.  The area has healed well.  There is no sign of infection.  She is aware of the diagnosis and reason for negative margins.  Past Medical History  Diagnosis Date  . Dermatofibrosarcoma protuberans of right lower extremity   . Adiposity     Past Surgical History  Procedure Laterality Date  . Cesarean section      Family History  Problem Relation Age of Onset  . Diabetes Maternal Grandmother   . Cancer Maternal Grandmother    Social History:  reports that she has quit smoking. She has never used smokeless tobacco. She reports that she uses illicit drugs (Marijuana) about once per week. Her alcohol history is not on file.  Allergies: No Known Allergies  No prescriptions prior to admission    No results found for this or any previous visit (from the past 48 hour(s)). No results found.  Review of Systems  Constitutional: Negative.   HENT: Negative.   Eyes: Negative.   Respiratory: Negative.   Cardiovascular: Negative.   Gastrointestinal: Negative.   Genitourinary: Negative.   Musculoskeletal: Negative.   Skin: Negative.   Neurological: Negative.   Psychiatric/Behavioral: Negative.     Blood pressure 109/55, pulse 64, temperature 98 F (36.7 C), temperature source Oral, resp. rate 16, height 5\' 2"  (1.575 m), weight 109.952 kg (242 lb 6.4 oz), last menstrual period 12/24/2015, SpO2 100 %. Physical Exam  Constitutional: She is oriented to person, place, and time. She appears well-developed and well-nourished.  HENT:  Head: Normocephalic and atraumatic.  Eyes: Conjunctivae and EOM are normal. Pupils are equal, round, and reactive to light.   Cardiovascular: Normal rate.   Respiratory: Effort normal.  GI: Soft.  Neurological: She is alert and oriented to person, place, and time.  Skin: Skin is warm.  Psychiatric: She has a normal mood and affect. Her behavior is normal. Judgment and thought content normal.     Assessment/Plan Plan for excision of right leg dermatofibrosarcoma lesion.  Wallace Going, DO 01/07/2016, 2:20 PM

## 2016-01-07 NOTE — Anesthesia Preprocedure Evaluation (Addendum)
Anesthesia Evaluation  Patient identified by MRN, date of birth, ID band Patient awake    Reviewed: Allergy & Precautions, NPO status , Patient's Chart, lab work & pertinent test results  Airway Mallampati: II  TM Distance: >3 FB Neck ROM: Full    Dental  (+) Teeth Intact, Dental Advisory Given   Pulmonary former smoker,    Pulmonary exam normal breath sounds clear to auscultation       Cardiovascular negative cardio ROS Normal cardiovascular exam Rhythm:Regular Rate:Normal     Neuro/Psych negative neurological ROS     GI/Hepatic negative GI ROS, (+)     substance abuse  marijuana use,   Endo/Other  Morbid obesity  Renal/GU negative Renal ROS     Musculoskeletal Right leg dermatofibrosarcoma   Abdominal   Peds  Hematology negative hematology ROS (+)   Anesthesia Other Findings Day of surgery medications reviewed with the patient.  Reproductive/Obstetrics                           Anesthesia Physical Anesthesia Plan  ASA: III  Anesthesia Plan: General   Post-op Pain Management:    Induction: Intravenous  Airway Management Planned: LMA and Oral ETT  Additional Equipment:   Intra-op Plan:   Post-operative Plan: Extubation in OR  Informed Consent: I have reviewed the patients History and Physical, chart, labs and discussed the procedure including the risks, benefits and alternatives for the proposed anesthesia with the patient or authorized representative who has indicated his/her understanding and acceptance.   Dental advisory given  Plan Discussed with: CRNA  Anesthesia Plan Comments: (Risks/benefits of general anesthesia discussed with patient including risk of damage to teeth, lips, gum, and tongue, nausea/vomiting, allergic reactions to medications, and the possibility of heart attack, stroke and death.  All patient questions answered.  Patient wishes to proceed.  LMA if  supine, ETT if prone.)       Anesthesia Quick Evaluation

## 2016-01-07 NOTE — Discharge Instructions (Signed)
May shower tomorrow.  Re-wrap wound with gauze and ace wrap daily. Dr. Eusebio Friendly office will call you with pathology results by the beginning of next week.   Post Anesthesia Home Care Instructions  Activity: Get plenty of rest for the remainder of the day. A responsible adult should stay with you for 24 hours following the procedure.  For the next 24 hours, DO NOT: -Drive a car -Paediatric nurse -Drink alcoholic beverages -Take any medication unless instructed by your physician -Make any legal decisions or sign important papers.  Meals: Start with liquid foods such as gelatin or soup. Progress to regular foods as tolerated. Avoid greasy, spicy, heavy foods. If nausea and/or vomiting occur, drink only clear liquids until the nausea and/or vomiting subsides. Call your physician if vomiting continues.  Special Instructions/Symptoms: Your throat may feel dry or sore from the anesthesia or the breathing tube placed in your throat during surgery. If this causes discomfort, gargle with warm salt water. The discomfort should disappear within 24 hours.  If you had a scopolamine patch placed behind your ear for the management of post- operative nausea and/or vomiting:  1. The medication in the patch is effective for 72 hours, after which it should be removed.  Wrap patch in a tissue and discard in the trash. Wash hands thoroughly with soap and water. 2. You may remove the patch earlier than 72 hours if you experience unpleasant side effects which may include dry mouth, dizziness or visual disturbances. 3. Avoid touching the patch. Wash your hands with soap and water after contact with the patch.

## 2016-01-07 NOTE — Anesthesia Procedure Notes (Signed)
Procedure Name: LMA Insertion Date/Time: 01/07/2016 2:32 PM Performed by: Lieutenant Diego Pre-anesthesia Checklist: Patient identified, Emergency Drugs available, Suction available and Patient being monitored Patient Re-evaluated:Patient Re-evaluated prior to inductionOxygen Delivery Method: Circle System Utilized Preoxygenation: Pre-oxygenation with 100% oxygen Intubation Type: IV induction Ventilation: Mask ventilation without difficulty LMA: LMA inserted LMA Size: 4.0 Laryngoscope Size: Miller and 2 Grade View: Grade I Tube type: Oral Tube size: 7.0 mm Number of attempts: 1 Airway Equipment and Method: Bite block Placement Confirmation: positive ETCO2 Secured at: 22 cm Tube secured with: Tape Dental Injury: Teeth and Oropharynx as per pre-operative assessment

## 2016-01-07 NOTE — Op Note (Signed)
Operative Note   DATE OF OPERATION: 01/07/2016  LOCATION: [facility]  SURGICAL DIVISION: Plastic Surgery  PREOPERATIVE DIAGNOSES:  Right leg dermatofibrosarcoma  POSTOPERATIVE DIAGNOSES:  same  PROCEDURE:  Excision of right leg dermatofibrosarcoma 2.5 cm x 6 cm  SURGEON: Wandell Scullion Sanger Talayla Doyel, DO  ASSISTANT: Shawn Rayburn, PA  ANESTHESIA:  General.   COMPLICATIONS: None.   INDICATIONS FOR PROCEDURE:  The patient, Gail Thomas is a 30 y.o. female born on 04/09/1986, is here for treatment of right leg skin lesion that was excised and had positive margins. MRN: [id number]  CONSENT:  Informed consent was obtained directly from the patient. Risks, benefits and alternatives were fully discussed. Specific risks including but not limited to bleeding, infection, hematoma, seroma, scarring, pain, infection, contracture, asymmetry, wound healing problems, and need for further surgery were all discussed. The patient did have an ample opportunity to have questions answered to satisfaction.   DESCRIPTION OF PROCEDURE:  The patient was taken to the operating room. SCDs were placed and IV antibiotics were given. The patient's operative site was prepped and draped in a sterile fashion. A time out was performed and all information was confirmed to be correct.  General anesthesia was administered.  The local was injected to help with intraoperative hemostasis and postoperative pain management.  The posterior right leg lesion was marked for 2.5 x 6 cm and the #10 blade was used to excise the area to fat.  The 12 o'clock position was marked with a short stitch and 3 o'clock with a long stitch.  The skin edges were closed with 3-0 and 5-0 Monocryl.  Xeroform and gauze were applied. The leg was wrapped with kerlex and an ace wrap. The patient tolerated the procedure well.  There were no complications. The patient was allowed to wake from anesthesia, extubated and taken to the  recovery room in satisfactory condition.

## 2016-01-08 ENCOUNTER — Encounter (HOSPITAL_BASED_OUTPATIENT_CLINIC_OR_DEPARTMENT_OTHER): Payer: Self-pay | Admitting: Plastic Surgery

## 2016-01-08 NOTE — Anesthesia Postprocedure Evaluation (Signed)
Anesthesia Post Note  Patient: Gail Thomas  Procedure(s) Performed: Procedure(s) (LRB): RE EXCISION OF RIGHT LEG DERMATOFIBROSARCOMA  (Right)  Patient location during evaluation: PACU Anesthesia Type: General Level of consciousness: awake and alert Pain management: pain level controlled Vital Signs Assessment: post-procedure vital signs reviewed and stable Respiratory status: spontaneous breathing, nonlabored ventilation, respiratory function stable and patient connected to nasal cannula oxygen Cardiovascular status: blood pressure returned to baseline and stable Postop Assessment: no signs of nausea or vomiting Anesthetic complications: no    Last Vitals:  Filed Vitals:   01/07/16 1555 01/07/16 1615  BP:  113/67  Pulse: 66 72  Temp:  37.1 C  Resp: 17 16    Last Pain:  Filed Vitals:   01/07/16 1625  PainSc: 0-No pain                 Catalina Gravel

## 2017-04-06 ENCOUNTER — Other Ambulatory Visit: Payer: Self-pay | Admitting: Obstetrics and Gynecology

## 2017-12-10 ENCOUNTER — Other Ambulatory Visit: Payer: Self-pay

## 2017-12-10 ENCOUNTER — Ambulatory Visit (HOSPITAL_COMMUNITY)
Admission: EM | Admit: 2017-12-10 | Discharge: 2017-12-10 | Disposition: A | Discharge status: Home / Self Care | Payer: Medicaid Other | Attending: Family Medicine | Admitting: Family Medicine

## 2017-12-10 ENCOUNTER — Encounter (HOSPITAL_COMMUNITY): Payer: Self-pay

## 2017-12-10 DIAGNOSIS — N898 Other specified noninflammatory disorders of vagina: Secondary | ICD-10-CM | POA: Diagnosis not present

## 2017-12-10 DIAGNOSIS — Z809 Family history of malignant neoplasm, unspecified: Secondary | ICD-10-CM | POA: Insufficient documentation

## 2017-12-10 DIAGNOSIS — Z833 Family history of diabetes mellitus: Secondary | ICD-10-CM | POA: Insufficient documentation

## 2017-12-10 DIAGNOSIS — O26899 Other specified pregnancy related conditions, unspecified trimester: Secondary | ICD-10-CM | POA: Diagnosis present

## 2017-12-10 DIAGNOSIS — Z85828 Personal history of other malignant neoplasm of skin: Secondary | ICD-10-CM | POA: Diagnosis not present

## 2017-12-10 DIAGNOSIS — Z87891 Personal history of nicotine dependence: Secondary | ICD-10-CM | POA: Insufficient documentation

## 2017-12-10 DIAGNOSIS — Z3201 Encounter for pregnancy test, result positive: Secondary | ICD-10-CM

## 2017-12-10 DIAGNOSIS — Z113 Encounter for screening for infections with a predominantly sexual mode of transmission: Secondary | ICD-10-CM | POA: Diagnosis not present

## 2017-12-10 LAB — POCT PREGNANCY, URINE: PREG TEST UR: POSITIVE — AB

## 2017-12-10 LAB — POCT URINALYSIS DIP (DEVICE)
BILIRUBIN URINE: NEGATIVE
Glucose, UA: NEGATIVE mg/dL
Ketones, ur: NEGATIVE mg/dL
NITRITE: NEGATIVE
PH: 5.5 (ref 5.0–8.0)
Protein, ur: NEGATIVE mg/dL
Urobilinogen, UA: 0.2 mg/dL (ref 0.0–1.0)

## 2017-12-10 NOTE — ED Triage Notes (Addendum)
Patient presents to [facility] for vaginal itching, pt states she and her partner were recently treated for Trich and maybe they did not wait long enough before having sex again, pt thinks it may have returned

## 2017-12-10 NOTE — Discharge Instructions (Signed)
W have sent testing for sexually transmitted infections. We will notify you of any positive results once they are received. If required, we will prescribe any medications you might need.

## 2017-12-11 LAB — URINE CULTURE: Culture: 20000 — AB

## 2017-12-12 ENCOUNTER — Telehealth (HOSPITAL_COMMUNITY): Payer: Self-pay | Admitting: Internal Medicine

## 2017-12-12 LAB — URINE CYTOLOGY ANCILLARY ONLY
Chlamydia: NEGATIVE
Neisseria Gonorrhea: NEGATIVE
Trichomonas: NEGATIVE

## 2017-12-12 MED ORDER — AMOXICILLIN 500 MG PO CAPS: 500.0000 mg | ORAL_CAPSULE | Freq: Two times a day (BID) | ORAL | 0 refills | Status: AC

## 2017-12-12 NOTE — Telephone Encounter (Signed)
Clinical staff, please let patient know that urine culture was positive for group B Strep germ.  Patient had a positive pregnancy test at urgent care visit.  Rx amoxicillin sent to the pharmacy of record, [facility] on [address].  Take all of the medicine.    Urine cytology tests are pending.  Please make appointment with [facility], [phone], to establish care and discuss next steps.  Jodi Mourning MD

## 2017-12-12 NOTE — ED Provider Notes (Signed)
[place]   [phone] 12/10/17 Arrival Time: 4818  ASSESSMENT & PLAN:  1. Screening for STDs (sexually transmitted diseases)    Pending: Labs Reviewed  POCT URINALYSIS DIP (DEVICE) - Abnormal; Notable for the following components:   Hgb urine dipstick TRACE (*)    Leukocytes, UA TRACE (*)    All other components within normal limits  POCT PREGNANCY, URINE - Abnormal; Notable for the following components:   Preg Test, Ur POSITIVE (*)    All other components within normal limits  URINE CYTOLOGY ANCILLARY ONLY   Is currently pregnant. Patient's last menstrual period was 09/14/2017 (exact date).   Urine cytology and culture pending. No current urinary symptoms. Will notify of any positive results. Instructed to refrain from sexual activity until results are back.  Reviewed expectations re: course of current medical issues. Questions answered. Outlined signs and symptoms indicating need for more acute intervention. Patient verbalized understanding. After Visit Summary given.   SUBJECTIVE:  Gail Thomas is a 32 y.o. female who presents with complaint of vaginal irritation. "Really slight. Can't even feel it now. Just noticed it this morning for a few seconds." No vaginal discharge. Urinary symptoms: none. Afebrile. No abdominal or pelvic pain. No n/v. No rashes or lesions. Sexually active with single female partner. Reports they were both recently treated for Trich.  Patient's last menstrual period was 09/14/2017 (exact date).  ROS: As per HPI.  OBJECTIVE:  Vitals:   12/10/17 1839 12/10/17 1840  BP: 130/83   Pulse: 96   Resp: 17   Temp: 97.7 F (36.5 C)   TempSrc: Oral   SpO2: 100% 100%    General appearance: alert, cooperative, appears stated age and no distress Throat: lips, mucosa, and tongue normal; teeth and gums normal Back: no CVA tenderness Abdomen: soft, non-tender GU: declines Skin: warm and dry Psychological:  Alert and cooperative. Normal  mood and affect.  Results for orders placed or performed during the hospital encounter of 12/10/17  POCT urinalysis dip (device)  Result Value Ref Range   Glucose, UA NEGATIVE NEGATIVE mg/dL   Bilirubin Urine NEGATIVE NEGATIVE   Ketones, ur NEGATIVE NEGATIVE mg/dL   Specific Gravity, Urine >=1.030 1.005 - 1.030   Hgb urine dipstick TRACE (A) NEGATIVE   pH 5.5 5.0 - 8.0   Protein, ur NEGATIVE NEGATIVE mg/dL   Urobilinogen, UA 0.2 0.0 - 1.0 mg/dL   Nitrite NEGATIVE NEGATIVE   Leukocytes, UA TRACE (A) NEGATIVE  Pregnancy, urine POC  Result Value Ref Range   Preg Test, Ur POSITIVE (A) NEGATIVE   No Known Allergies  Past Medical History:  Diagnosis Date  . Adiposity   . Dermatofibrosarcoma protuberans of right lower extremity    Family History  Problem Relation Age of Onset  . Diabetes Maternal Grandmother   . Cancer Maternal Grandmother    Social History   Socioeconomic History  . Marital status: Single    Spouse name: Not on file  . Number of children: Not on file  . Years of education: Not on file  . Highest education level: Not on file  Social Needs  . Financial resource strain: Not on file  . Food insecurity - worry: Not on file  . Food insecurity - inability: Not on file  . Transportation needs - medical: Not on file  . Transportation needs - non-medical: Not on file  Occupational History  . Not on file  Tobacco Use  . Smoking status: Former Research scientist (life sciences)  . Smokeless tobacco: Never  Used  Substance and Sexual Activity  . Alcohol use: Not on file  . Drug use: Yes    Frequency: 1.0 times per week    Types: Marijuana  . Sexual activity: Yes    Birth control/protection: Condom  Other Topics Concern  . Not on file  Social History Narrative  . Not on file          Vanessa Kick, MD 12/12/17 1010

## 2017-12-14 LAB — URINE CYTOLOGY ANCILLARY ONLY
Bacterial vaginitis: NEGATIVE
CANDIDA VAGINITIS: NEGATIVE

## 2020-03-26 ENCOUNTER — Encounter: Payer: 59 | Attending: General Surgery | Admitting: Skilled Nursing Facility1

## 2020-03-26 DIAGNOSIS — E669 Obesity, unspecified: Secondary | ICD-10-CM | POA: Insufficient documentation

## 2020-03-28 ENCOUNTER — Encounter: Payer: Self-pay | Admitting: Dietician

## 2020-03-28 ENCOUNTER — Encounter: Payer: 59 | Admitting: Dietician

## 2020-03-28 ENCOUNTER — Other Ambulatory Visit: Payer: Self-pay

## 2020-03-28 DIAGNOSIS — E669 Obesity, unspecified: Secondary | ICD-10-CM

## 2020-03-28 NOTE — Patient Instructions (Signed)
Begin working through the [employer] discussed today, starting with one you feel may be the most difficult for you.   Consider starting a daily multivitamin.

## 2020-03-28 NOTE — Progress Notes (Signed)
Nutrition Assessment for Bariatric Surgery Medical Nutrition Therapy   Patient was seen on 03/28/2020 for Pre-Operative Nutrition Assessment. Letter of approval faxed to [facility] bariatric surgery program coordinator on 03/28/2020.   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 Does not have much time to snack, typical meal pattern is 3 meals per day plus a protein drink such as Muscle Milk or Premier Protein. States she likes to meal prep and eat well. Does not eat red meat so will substitute [place] (for ground beef, burgers, etc.) May have [place] burger patty with rice and vegetables, salad with veggies and chicken, ground [place] spaghetti. Drinks water and protein drinks, sometimes Sprite. Does not currently take any vitamins/supplements.   24-Hr Dietary Recall First Meal: hard boiled eggs + oatmeal + banana  Snack: Muscle Milk protein shake  Second Meal: chicken broccoli casserole Snack: - Third Meal: ground [place] tacos  Snack: - Beverages: water (32-48 oz per day), protein shakes, sometimes Sprite    Estimated Energy Needs Calories: 1600-1800 Carbohydrate: 180-200g Protein: 100-130g Fat: 53-60g   NUTRITION DIAGNOSIS  Overweight/obesity ([place]-3.3) related to past poor dietary habits and physical inactivity as evidenced by patient w/ planned Sleeve Gastrectomy surgery following dietary guidelines for continued weight loss.    NUTRITION INTERVENTION  Nutrition counseling (C-1) and education (E-2) to facilitate bariatric surgery goals.  Pre-Op Goals Reviewed with the Patient . Track food and beverage  intake (pen and paper, MyFitness Pal, Baritastic app, etc.) . Make healthy food choices while monitoring portion sizes . Consume 3 meals per day or try to eat every 3-5 hours . Avoid concentrated sugars and fried foods . Keep sugar & fat in the single digits per serving on food labels . Practice CHEWING your food (aim for applesauce consistency) . Practice not drinking 15 minutes before, during, and 30 minutes after each meal and snack . Avoid all carbonated beverages (ex: soda, sparkling beverages)  . Limit caffeinated beverages (ex: coffee, tea, energy drinks) . Avoid all sugar-sweetened beverages (ex: regular soda, sports drinks)  . Avoid alcohol  . Aim for 64-100 ounces of FLUID daily (with at least half of fluid intake being plain water)  . Aim for at least 60-80 grams of PROTEIN daily . Look for a liquid protein source that contains ?15 g protein and ?5 g carbohydrate (ex: shakes, drinks, shots) . Make a list of non-food related activities . Physical activity is an important part of a healthy lifestyle so keep it moving! The goal is to reach 150 minutes of exercise per week, including cardiovascular and weight baring activity.  *Goals that are bolded indicate the pt would like to start working towards these  Handouts Provided Include  . Bariatric Surgery handouts (Nutrition Visits, Pre-Op Goals, Protein Shakes, Vitamins & Minerals)  Learning Style & Readiness for Change Teaching method utilized: Visual & Auditory  Demonstrated degree of understanding via: Teach Back  Barriers to learning/adherence to lifestyle change: None Identified    MONITORING & EVALUATION Dietary intake, weekly physical activity, body weight, and pre-op goals reached at next nutrition visit.   Next Steps Patient  is to return to [facility] in 1 month for 1st SWL Visit.

## 2020-04-28 ENCOUNTER — Other Ambulatory Visit: Payer: Self-pay

## 2020-04-28 ENCOUNTER — Encounter: Payer: 59 | Attending: General Surgery | Admitting: Skilled Nursing Facility1

## 2020-04-28 DIAGNOSIS — E669 Obesity, unspecified: Secondary | ICD-10-CM | POA: Diagnosis present

## 2020-04-28 NOTE — Progress Notes (Signed)
Supervised Weight Loss Visit Bariatric Nutrition Education  1 out of 6 SWL Appointments   Star given previously: no  NUTRITION ASSESSMENT  Referral stated Supervised Weight Loss (SWL) visits needed: 6  Planned surgery: Sleeve  Pt expectation of surgery: to have more energy, have healthier lifestyle, live longer, be around for her kids   NUTRITION ASSESSMENT   Anthropometrics  Start weight at [facility]: 273 lbs (date: 03/28/2020) Weight today: 267 BMI: 48.93 kg/m2     Lifestyle & Dietary Hx  Pt states she lives with her partner and children.   Pt has lost about 6 pounds. Pt states she has done well with drinking more water and not drinking with meals. Pt states she has not been drinking as much soda and making healthier choices. Pt states she has also started eating out less often. Pt states she like sot meal prep. Pt states she does know the difference between hunger and apatite and fullness verses satisfaction. Pt states she never experiences guilt after eating.  Pt state she does eat non starchy vegetables 2 times a day 7 days a week. Pt states she works out at [employer] 3 days a week (2 months of this).    24-Hr Dietary Recall First Meal: hard boiled eggs + oatmeal + banana or cheerios with banana  Snack: Muscle Milk protein shake  Second Meal: chicken salad or tuna salad Snack: -banana + peanut butter Third Meal: ground [place] tacos or teriaky chicken with brown rice  Snack: - Beverages: water (32-48 oz per day), protein shakes, sometimes Sprite, juice   Estimated Energy Needs Calories: 1600-1800 Carbohydrate: 180-200g Protein: 100-130g Fat: 53-60g  NUTRITION DIAGNOSIS  Overweight/obesity ([place]-3.3) related to past poor dietary habits and physical inactivity as evidenced by patient w/ planned sleeve surgery following dietary guidelines for continued weight loss.   NUTRITION INTERVENTION  Nutrition counseling (C-1) and education (E-2) to facilitate bariatric surgery  goals.  Pre-Op Goals Progress & New Goals . Continue: increase water . Continue: do not drink with meals . Continue: not eating fast food  Handouts Provided Include   Detailed MyPlate  Learning Style & Readiness for Change Teaching method utilized: Visual & Auditory  Demonstrated degree of understanding via: Teach Back  Barriers to learning/adherence to lifestyle change: none identified   RD's Notes for next Visit  . Assess pts adherence to chosen goals   MONITORING & EVALUATION Dietary intake, weekly physical activity, body weight, and pre-op goals in 1 month.   Next Steps  Patient is to return to [facility]

## 2020-05-26 ENCOUNTER — Other Ambulatory Visit: Payer: Self-pay

## 2020-05-26 ENCOUNTER — Encounter: Payer: 59 | Attending: General Surgery | Admitting: Skilled Nursing Facility1

## 2020-05-26 DIAGNOSIS — E669 Obesity, unspecified: Secondary | ICD-10-CM | POA: Diagnosis present

## 2020-05-26 NOTE — Progress Notes (Signed)
Supervised Weight Loss Visit Bariatric Nutrition Education  2 out of 6 SWL Appointments   Star given previously: no  NUTRITION ASSESSMENT  Referral stated Supervised Weight Loss (SWL) visits needed: 6  Planned surgery: Sleeve  Pt expectation of surgery: to have more energy, have healthier lifestyle, live longer, be around for her kids   NUTRITION ASSESSMENT   Anthropometrics  Start weight at [facility]: 273 lbs (date: 03/28/2020) Weight today: 268.7 BMI: 49.15 kg/m2     Lifestyle & Dietary Hx  Pt states she lives with her partner and children.   Pt states she has been working out at [employer] 60 minutes every other day. Pt states she realizes she is in charge of her own success. Pt states she will call her support system if she does have a struggling moment. Pt states she is in charge of her own health and understands she cannot be successful if she does not get her mind right.     24-Hr Dietary Recall First Meal: hard boiled eggs + oatmeal + sausage  Snack: Muscle Milk protein shake  Second Meal: chicken salad or tuna salad or baked chicken with broccoli and white rice Snack: -banana + peanut butter or apple Third Meal: ground [place] tacos or teriaky chicken with brown rice  Or spaghetti with toast + salad Snack: - Beverages: water (32-48 oz per day), protein shakes, sometimes Sprite, body armour   Estimated Energy Needs Calories: 1600-1800 Carbohydrate: 180-200g Protein: 100-130g Fat: 53-60g  NUTRITION DIAGNOSIS  Overweight/obesity ([place]-3.3) related to past poor dietary habits and physical inactivity as evidenced by patient w/ planned sleeve surgery following dietary guidelines for continued weight loss.   NUTRITION INTERVENTION  Nutrition counseling (C-1) and education (E-2) to facilitate bariatric surgery goals.  Pre-Op Goals Progress & New Goals  Continue: increase water  Continue: do not drink with meals  Continue: not eating fast food  Continue: to stay in the  moment and aware of fullness   Handouts Provided Include   Detailed MyPlate  Learning Style & Readiness for Change Teaching method utilized: Visual & Auditory  Demonstrated degree of understanding via: Teach Back  Barriers to learning/adherence to lifestyle change: none identified   RD's Notes for next Visit   Assess pts adherence to chosen goals   MONITORING & EVALUATION Dietary intake, weekly physical activity, body weight, and pre-op goals in 1 month.   Next Steps  Patient is to return to [facility]

## 2020-06-20 ENCOUNTER — Ambulatory Visit: Payer: Medicaid Other | Admitting: Dietician

## 2020-06-23 ENCOUNTER — Ambulatory Visit: Payer: Medicaid Other | Admitting: Dietician

## 2020-07-07 ENCOUNTER — Encounter: Payer: 59 | Attending: General Surgery | Admitting: Dietician

## 2020-07-07 ENCOUNTER — Other Ambulatory Visit: Payer: Self-pay

## 2020-07-07 ENCOUNTER — Encounter: Payer: Self-pay | Admitting: Dietician

## 2020-07-07 DIAGNOSIS — E669 Obesity, unspecified: Secondary | ICD-10-CM | POA: Diagnosis present

## 2020-07-07 NOTE — Progress Notes (Signed)
Supervised Weight Loss Visit Bariatric Nutrition Education  3 out of 6 SWL Appointments    NUTRITION ASSESSMENT  Referral stated Supervised Weight Loss (SWL) visits needed: 6  Planned surgery: Sleeve  Pt expectation of surgery: to have more energy, have healthier lifestyle, live longer, be around for her kids   NUTRITION ASSESSMENT   Anthropometrics  Start weight at [facility]: 273 lbs (date: 03/28/2020) Weight today: 263.8 BMI: 48.25 kg/m2     Lifestyle & Dietary Hx Patient states she is physically active every day and spends lots of time outside with her kids. States she is doing really well at drinking lots of fluids, primarily water. Typical meal pattern lately is 3 meals per day, not as hungry for snacks during the day since she gets full from drinking lots of water. States she chews her food thoroughly (partially due to braces) and stops before she feels full.   24-Hr Dietary Recall First Meal: Frosted Flakes + banana  Snack: -  Second Meal: 6-inch sub  Snack: - Third Meal: spaghetti  Snack: - Beverages: water, juice sometimes  Estimated Energy Needs Calories: 1600-1800 Carbohydrate: 180-200g Protein: 100-130g Fat: 53-60g   NUTRITION DIAGNOSIS  Overweight/obesity ([place]-3.3) related to past poor dietary habits and physical inactivity as evidenced by patient w/ planned sleeve surgery following dietary guidelines for continued weight loss.   NUTRITION INTERVENTION  Nutrition counseling (C-1) and education (E-2) to facilitate bariatric surgery goals.  Pre-Op Goals Progress & New Goals . Continue: drinking lots of water . Continue: not drinking with meals . Continue: not eating fast food . Continue: to stay in the moment and aware of fullness  . Continue: chewing food thoroughly   Learning Style & Readiness for Change Teaching method utilized: Visual & Auditory  Demonstrated degree of understanding via: Teach Back  Barriers to learning/adherence to lifestyle change:  none identified    MONITORING & EVALUATION Dietary intake, weekly physical activity, body weight, and pre-op goals in 1 month.   Next Steps  Patient is to return to [facility] in 1 month for 4th SWL visit.

## 2020-08-06 ENCOUNTER — Ambulatory Visit: Payer: 59 | Admitting: Skilled Nursing Facility1

## 2022-01-12 ENCOUNTER — Other Ambulatory Visit (HOSPITAL_COMMUNITY): Payer: Self-pay | Admitting: General Surgery

## 2022-01-12 ENCOUNTER — Other Ambulatory Visit: Payer: Self-pay | Admitting: General Surgery

## 2022-01-27 ENCOUNTER — Ambulatory Visit (HOSPITAL_COMMUNITY)
Admission: RE | Admit: 2022-01-27 | Discharge: 2022-01-27 | Disposition: A | Discharge status: Home / Self Care | Payer: 59 | Source: Ambulatory Visit | Attending: General Surgery | Admitting: General Surgery

## 2022-01-27 ENCOUNTER — Other Ambulatory Visit: Payer: Self-pay

## 2022-02-01 ENCOUNTER — Other Ambulatory Visit: Payer: Self-pay

## 2022-02-01 ENCOUNTER — Encounter: Payer: 59 | Attending: General Surgery | Admitting: Skilled Nursing Facility1

## 2022-02-01 DIAGNOSIS — E669 Obesity, unspecified: Secondary | ICD-10-CM | POA: Insufficient documentation

## 2022-02-01 NOTE — Progress Notes (Signed)
Nutrition Assessment for Bariatric Surgery ?Medical Nutrition Therapy ?Appt Start Time: 10:01    End Time: 10:41 ? ?Patient was seen on 02/01/2022 for Pre-Operative Nutrition Assessment. Letter of approval faxed to [facility] bariatric surgery program coordinator on 02/01/2022.  ? ?Referral stated Supervised Weight Loss (SWL) visits needed: 0 ? ?Pt completed visits. ?  ?Pt has cleared nutrition requirements.  ? ? ? ?Planned surgery: sleeve gastrectomy  ?Pt expectation of surgery: to lose weight ?Pt expectation of dietitian: none identified  ? ?  ?NUTRITION ASSESSMENT ?  ?Anthropometrics  ?Start weight at [facility]: 274.4 lbs (date: 02/01/2022)  ?Height: 62 in ?BMI: 50.19 kg/m2   ?  ?Clinical  ?Medical hx: skin cancer on her leg: tumor removed ?Medications: N/A  ?Labs: not drawn yet ?Notable signs/symptoms: none reported  ?Any previous deficiencies? No ? ?Micronutrient Nutrition Focused Physical Exam: ?Hair: No issues observed ?Eyes: No issues observed ?Mouth: No issues observed ?Neck: No issues observed ?Nails: No issues observed ?Skin: No issues observed ? ?Lifestyle & Dietary Hx ? ?Pt state she is in a program with her insurance company for lifestyle change including food scale, regular scale, sectioned plate and weekly check ins.  ?Pt state she is actively working on eating to satisfaction rather than fullness.   ? ?24-Hr Dietary Recall ?First Meal: omelet + sausage + onion + spinach + toast ?Snack:  ?Second Meal: [place] sandwich + chips ?Snack:  ?Third Meal: chicken, vegetable and potato ?Snack:  ?Beverages: water, protien shakes, whole milk, sweet tea ?  ?Estimated Energy Needs ?Calories: 1500 ? ? ?NUTRITION DIAGNOSIS  ?Overweight/obesity ([place]-3.3) related to past poor dietary habits and physical inactivity as evidenced by patient w/ planned sleeve gastrectomy surgery following dietary guidelines for continued weight loss. ?  ? ?NUTRITION INTERVENTION  ?Nutrition counseling (C-1) and education (E-2) to  facilitate bariatric surgery goals. ? ?Educated pt on micronutrient deficiencies post surgery and strategies to mitigate that risk ?  ?Pre-Op Goals Reviewed with the Patient ?Track food and beverage intake (pen and paper, MyFitness Pal, Baritastic app, etc.) ?Make healthy food choices while monitoring portion sizes ?Consume 3 meals per day or try to eat every 3-5 hours ?Avoid concentrated sugars and fried foods ?Keep sugar & fat in the single digits per serving on food labels ?Practice CHEWING your food (aim for applesauce consistency) ?Practice not drinking 15 minutes before, during, and 30 minutes after each meal and snack ?Avoid all carbonated beverages (ex: soda, sparkling beverages)  ?Limit caffeinated beverages (ex: coffee, tea, energy drinks) ?Avoid all sugar-sweetened beverages (ex: regular soda, sports drinks)  ?Avoid alcohol  ?Aim for 64-100 ounces of FLUID daily (with at least half of fluid intake being plain water)  ?Aim for at least 60-80 grams of PROTEIN daily ?Look for a liquid protein source that contains ?15 g protein and ?5 g carbohydrate (ex: shakes, drinks, shots) ?Make a list of non-food related activities ?Physical activity is an important part of a healthy lifestyle so keep it moving! The goal is to reach 150 minutes of exercise per week, including cardiovascular and weight baring activity. ? ?*Goals that are bolded indicate the pt would like to start working towards these ? ?Handouts Provided Include  ?Bariatric Surgery handouts (Nutrition Visits, Pre-Op Goals, Protein Shakes, Vitamins & Minerals) ? ?Learning Style & Readiness for Change ?Teaching method utilized: Visual & Auditory  ?Demonstrated degree of understanding via: Teach Back  ?Readiness Level: action ?Barriers to learning/adherence to lifestyle change: none idnetified  ?  ?  ?MONITORING &  EVALUATION ?Dietary intake, weekly physical activity, body weight, and pre-op goals reached at next nutrition visit.  ?  ?Next Steps  ?Patient is  to follow up at [facility] for Pre-Op Class >2 weeks before surgery for further nutrition education.  ?Pt has completed visits. No further supervised visits required/recomended  ?

## 2022-03-12 ENCOUNTER — Ambulatory Visit (INDEPENDENT_AMBULATORY_CARE_PROVIDER_SITE_OTHER): Payer: 59 | Admitting: Licensed Clinical Social Worker

## 2022-03-12 DIAGNOSIS — F4320 Adjustment disorder, unspecified: Secondary | ICD-10-CM | POA: Diagnosis not present

## 2022-03-12 NOTE — Progress Notes (Signed)
Comprehensive Clinical Assessment (CCA) Note ? ?03/12/2022 ?GEARLDENE Gail Thomas ?[id number] ? ?Chief Complaint:  ?Chief Complaint  ?Patient presents with  ? Obesity  ? ?Visit Diagnosis: Adjustment disorder, unspecified type   ? ? ?CCA Biopsychosocial ?Intake/Chief Complaint:  Bariatric surgery ? ?Current Symptoms/Problems: Denies anxiety, denies depression, denies psychosis ? ? ?Patient Reported Schizophrenia/Schizoaffective Diagnosis in Past: No ? ? ?Strengths: good Metallurgist, understanding, people person ? ?Preferences: patient is flexible, it depends on the day on her preferences, she is easy going ? ?Abilities: good Metallurgist, make up artist, being a good dialysis tech ? ? ?Type of Services Patient Feels are Needed: Bariatric Surgery ? ? ?Initial Clinical Notes/Concerns: Struggles with weight started after her second child. Patient had 2 children back to back. No family history of obesity. Has 3 children, all were C-sections. She recovered well. 2016 has a fiborus lump removed from her leg. ? ? ?Mental Health Symptoms ?Depression:   ?None ?  ?Duration of Depressive symptoms: No data recorded  ?Mania:   ?None ?  ?Anxiety:    ?None ?  ?Psychosis:   ?None ?  ?Duration of Psychotic symptoms: No data recorded  ?Trauma:   ?None ?  ?Obsessions:   ?None ?  ?Compulsions:   ?None ?  ?Inattention:   ?None ?  ?Hyperactivity/Impulsivity:   ?None ?  ?Oppositional/Defiant Behaviors:   ?None ?  ?Emotional Irregularity:   ?None ?  ?Other Mood/Personality Symptoms:   ?None ?  ? ?Mental Status Exam ?Appearance and self-care  ?Stature:   ?Average ?  ?Weight:   ?Obese ?  ?Clothing:   ?Casual ?  ?Grooming:   ?Normal ?  ?Cosmetic use:   ?Excessive ?  ?Posture/gait:   ?Normal ?  ?Motor activity:   ?Not Remarkable ?  ?Sensorium  ?Attention:   ?Normal ?  ?Concentration:   ?Normal ?  ?Orientation:   ?X5 ?  ?Recall/memory:   ?Normal ?  ?Affect and Mood  ?Affect:   ?Appropriate ?  ?Mood:   ?Euthymic ?  ?Relating  ?Eye contact:   ?Normal ?   ?Facial expression:   ?Responsive ?  ?Attitude toward examiner:   ?Cooperative ?  ?Thought and Language  ?Speech flow:  ?Normal ?  ?Thought content:   ?Appropriate to Mood and Circumstances ?  ?Preoccupation:   ?None ?  ?Hallucinations:   ?None ?  ?Organization:  No data recorded  ?Executive Functions  ?Fund of Knowledge:   ?Good ?  ?Intelligence:   ?Average ?  ?Abstraction:   ?Normal ?  ?Judgement:   ?Good ?  ?Reality Testing:   ?Realistic ?  ?Insight:   ?Present ?  ?Decision Making:   ?Normal ?  ?Social Functioning  ?Social Maturity:   ?Responsible ?  ?Social Judgement:   ?Normal ?  ?Stress  ?Stressors:  No data recorded  ?Coping Ability:   ?Normal ?  ?Skill Deficits:   ?None ?  ?Supports:   ?Family ?  ? ? ?Religion: ?Religion/Spirituality ?Are You A Religious Person?: Yes ?What is Your Religious Affiliation?: Darrick Meigs ?How Might This Affect Treatment?: Support in treatment ? ?Leisure/Recreation: ?Leisure / Recreation ?Do You Have Hobbies?: Yes ?Leisure and Hobbies: listen to music, play with her boys, tik tok ? ?Exercise/Diet: ?Exercise/Diet ?Do You Exercise?: Yes ?What Type of Exercise Do You Do?: Other (Comment), Run/Walk, Weight Training (jump rope) ?How Many Times a Week Do You Exercise?: 4-5 times a week ?Have You Gained or Lost A Significant Amount of Weight in the Past Six Months?: No ?Do You  Follow a Special Diet?: Yes ?Type of Diet: variety, balance, portion control ?Do You Have Any Trouble Sleeping?: No ? ? 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 ? ?CCA Family/Childhood History ?Family and Relationship History: ?Family history ?Marital status: Long term relationship ?Long term relationship, how long?: 5 years ?What types of issues is patient dealing with in the relationship?: None ?Additional relationship information: None ?Are you sexually active?: Yes ?What is your sexual orientation?: Heterosexual ?Has your sexual activity been affected by drugs, alcohol, medication, or emotional stress?: None ?Does patient have children?: Yes ?How many children?: 3 ?How is patient's relationship with their children?: Sons: good relationship ? ?Childhood History:  ?Childhood History ?By whom was/is the patient raised?: Mother ?Additional childhood history information: Mother was in the home. Biological father was in her life. Patient describes childhood "things weren't handed to [place], We had to work for it." ?Description of patient's relationship with caregiver when they were a child: Mother: amazing, Father: good ?Patient's description of current relationship with people who raised him/her: Mother: great, Father: great ?How were you disciplined when you got in trouble as a child/adolescent?: spanked, talked to, grounded ?Does patient have siblings?: Yes ?Number of Siblings: 4 ?Description of patient's current relationship with  siblings: 2 brothers, 2 sisters: great relationship ?Did patient suffer any verbal/emotional/physical/sexual abuse as a child?: No ?Did patient suffer from severe childhood neglect?: No ?Has patient ever been sexually abused/assaulted/raped as an adolescent or adult?: No ?Was the patient ever a victim of a crime or a disaster?: No ?Witnessed domestic violence?: No ?Has patient been affected by domestic violence as an adult?: No ? ?Child/Adolescent Assessment: ?  ? ? ?CCA Substance Use ?Alcohol/Drug Use: ?Alcohol / Drug Use ?Pain Medications: See patient MAR ?Prescriptions: See patient MAR ?Over the Counter: See patient MAR ?History of alcohol / drug use?: No history of alcohol / drug abuse ?  ?  ?  ?  ?   ?  ?  ?  ?  ?  ? ? ? ?ASAM's:  Six Dimensions of Multidimensional Assessment ? ?Dimension 1:  Acute Intoxication and/or Withdrawal Potential:   ?Dimension 1:  Description of individual's past and current experiences of substance use and withdrawal: None  ?Dimension 2:  Biomedical Conditions and Complications:   ?Dimension 2:  Description of patient's biomedical conditions and  complications: None  ?Dimension 3:  Emotional, Behavioral, or Cognitive Conditions and Complications:  Dimension 3:  Description of emotional, behavioral, or  cognitive conditions and complications: None  ?Dimension 4:  Readiness to Change:  Dimension 4:  Description of Readiness to Change criteria: None  ?Dimension 5:  Relapse, Continued use, or Continued Problem Potential:  Dimension 5:  Relapse, continued use, or continued problem potential critiera description: NOne  ?Dimension 6:  Recovery/Living Environment:  Dimension 6:  Recovery/Iiving environment criteria description: None  ?ASAM Severity Score: ASAM's Severity Rating Score: 0  ?ASAM Recommended Level of Treatment:    ? ?Substance use Disorder (SUD) ?  ? ?Recommendations for Services/Supports/Treatments: ?Recommendations for Services/Supports/Treatments ?Recommendations For  Services/Supports/Treatments:  (Bariatric Surgery) ? ?DSM5 Diagnoses: ?Patient Active Problem List  ? Diagnosis Date Noted  ? 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 Benign neoplasm of skin of low

## 2022-08-17 ENCOUNTER — Ambulatory Visit
Admission: EM | Admit: 2022-08-17 | Discharge: 2022-08-17 | Disposition: A | Discharge status: Home / Self Care | Payer: 59 | Attending: Physician Assistant | Admitting: Physician Assistant

## 2022-08-17 DIAGNOSIS — J0100 Acute maxillary sinusitis, unspecified: Secondary | ICD-10-CM | POA: Diagnosis not present

## 2022-08-17 MED ORDER — AMOXICILLIN 400 MG/5ML PO SUSR: 875.0000 mg | Freq: Two times a day (BID) | ORAL | 0 refills | Status: AC

## 2022-08-17 MED ORDER — FLUTICASONE PROPIONATE 50 MCG/ACT NA SUSP: 1.0000 | Freq: Every day | NASAL | 0 refills | Status: DC

## 2022-08-17 NOTE — ED Triage Notes (Signed)
Pt presents to [facility] with co of otalgia and congestion. Pt reports shes been sick for one week but the ear pain started this week, pt repots otc medications not helping.

## 2022-08-17 NOTE — ED Provider Notes (Signed)
[facility]    CSN: [id number] Arrival date & time: 08/17/22  1708      History   Chief Complaint Chief Complaint  Patient presents with   Otalgia    HPI Gail Thomas is a 36 y.o. female.   Patient here today for evaluation of sinus congestion and pressure that she has had for over a week with new onset ear pressure.  She denies any significant ear pain.  She denies any significant cough or sore throat.  She has not been taking any medication for symptoms.  The history is provided by the patient.  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      Home Medications    Prior to Admission medications   Medication Sig Start Date End Date Taking? Authorizing Provider  amoxicillin (AMOXIL) 400 MG/5ML suspension Take 10.9 mLs (875 mg total) by mouth 2 (two) times daily for 7 days. 08/17/22 08/24/22 Yes Francene Finders, PA-C  fluticasone (FLONASE) 50 MCG/ACT nasal spray Place 1 spray into both nostrils daily. 08/17/22  Yes Francene Finders, PA-C    Family  History Family History  Problem Relation Age of Onset   Diabetes Maternal Grandmother    Cancer Maternal Grandmother     Social History Social History   Tobacco Use   Smoking status: Former   Smokeless tobacco: Never  Substance Use Topics   Alcohol use: Yes    Comment: socially   Drug use: Not Currently    Frequency: 1.0 times per week    Types: Marijuana     Allergies   Patient has no known allergies.   Review of Systems Review of Systems  Constitutional:  Positive for fever (intermittent subjective). Negative for chills.  HENT:  Positive for sinus pressure. Negative for ear pain.   Eyes:  Negative for discharge and redness.  Respiratory:  Negative for cough and shortness of breath.   Gastrointestinal:  Negative for nausea and vomiting.     Physical Exam Triage Vital Signs ED Triage Vitals  Enc Vitals Group     BP 08/17/22 1721 117/75     Pulse Rate 08/17/22 1721 77     Resp 08/17/22 1721 18     Temp 08/17/22 1721 98.1 F (36.7 C)     Temp src --      SpO2 08/17/22 1721 98 %     Weight --      Height --      Head Circumference --      Peak Flow --  Pain Score 08/17/22 1724 4     Pain Loc --      Pain Edu? --      Excl. in [place]? --    No data found.  Updated Vital Signs BP 117/75   Pulse 77   Temp 98.1 F (36.7 C)   Resp 18   SpO2 98%      Physical Exam Vitals and nursing note reviewed.  Constitutional:      General: She is not in acute distress.    Appearance: Normal appearance. She is not ill-appearing.  HENT:     Head: Normocephalic and atraumatic.     Right Ear: Tympanic membrane normal.     Left Ear: Tympanic membrane normal.     Nose: Congestion present.     Mouth/Throat:     Mouth: Mucous membranes are moist.     Pharynx: Oropharynx is clear. No oropharyngeal exudate or posterior oropharyngeal erythema.  Eyes:     Conjunctiva/sclera: Conjunctivae normal.  Cardiovascular:     Rate and Rhythm: Normal rate and regular rhythm.      Heart sounds: Normal heart sounds.  Pulmonary:     Effort: Pulmonary effort is normal. No respiratory distress.     Breath sounds: Normal breath sounds. No wheezing, rhonchi or rales.  Neurological:     Mental Status: She is alert.  Psychiatric:        Mood and Affect: Mood normal.        Behavior: Behavior normal.        Thought Content: Thought content normal.      UC Treatments / Results  Labs (all labs ordered are listed, but only abnormal results are displayed) Labs Reviewed - No data to display  EKG   Radiology No results found.  Procedures Procedures (including critical care time)  Medications Ordered in UC Medications - No data to display  Initial Impression / Assessment and Plan / UC Course  I have reviewed the triage vital signs and the nursing notes.  Pertinent labs & imaging results that were available during my care of the patient were reviewed by me and considered in my medical decision making (see chart for details).    Suspect patient has developed sinusitis.  Will treat with amoxicillin and Flonase.  Recommended follow-up if no gradual improvement or with any further concerns.  Patient expresses understanding.  Final Clinical Impressions(s) / UC Diagnoses   Final diagnoses:  Acute maxillary sinusitis, recurrence not specified   Discharge Instructions   None    ED Prescriptions     Medication Sig Dispense Auth. Provider   amoxicillin (AMOXIL) 400 MG/5ML suspension Take 10.9 mLs (875 mg total) by mouth 2 (two) times daily for 7 days. 155 mL Ewell Poe F, PA-C   fluticasone [facility]) 50 MCG/ACT nasal spray Place 1 spray into both nostrils daily. 15.8 mL Francene Finders, PA-C      PDMP not reviewed this encounter.   Francene Finders, PA-C 08/17/22 [phone]

## 2022-11-11 ENCOUNTER — Ambulatory Visit
Admission: EM | Admit: 2022-11-11 | Discharge: 2022-11-11 | Disposition: A | Discharge status: Home / Self Care | Payer: 59 | Attending: Internal Medicine | Admitting: Internal Medicine

## 2022-11-11 DIAGNOSIS — R350 Frequency of micturition: Secondary | ICD-10-CM | POA: Insufficient documentation

## 2022-11-11 DIAGNOSIS — N3001 Acute cystitis with hematuria: Secondary | ICD-10-CM

## 2022-11-11 DIAGNOSIS — R300 Dysuria: Secondary | ICD-10-CM | POA: Diagnosis present

## 2022-11-11 LAB — POCT URINALYSIS DIP (MANUAL ENTRY)
Bilirubin, UA: NEGATIVE
Glucose, UA: NEGATIVE mg/dL
Ketones, POC UA: NEGATIVE mg/dL
Nitrite, UA: NEGATIVE
Protein Ur, POC: NEGATIVE mg/dL
Spec Grav, UA: 1.025 (ref 1.010–1.025)
Urobilinogen, UA: 0.2 E.U./dL
pH, UA: 7 (ref 5.0–8.0)

## 2022-11-11 LAB — POCT URINE PREGNANCY: Preg Test, Ur: NEGATIVE

## 2022-11-11 MED ORDER — CEPHALEXIN 250 MG/5ML PO SUSR: 250.0000 mg | Freq: Four times a day (QID) | ORAL | 0 refills | Status: AC

## 2022-11-11 NOTE — Discharge Instructions (Addendum)
Pregnancy test was negative.  You have a urinary tract infection which is being treated with an antibiotic.  Urine culture is pending.  Will call if it is abnormal.  Please follow-up if symptoms persist or worsen.

## 2022-11-11 NOTE — ED Provider Notes (Signed)
[facility]    CSN: [id number] Arrival date & time: 11/11/22  1759      History   Chief Complaint Chief Complaint  Patient presents with   Dysuria    HPI Gail Thomas is a 37 y.o. female.   Patient presents with dysuria, urinary frequency, mild hematuria that started about 4 days ago.  Denies vaginal discharge, abnormal vaginal bleeding, abdominal pain, back pain, fever.  Patient denies concern or exposure to STD.  Patient reports that she does not have menstrual cycles given IUD in place.   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      Home Medications    Prior to Admission medications   Medication Sig Start Date End Date Taking? Authorizing Provider  cephALEXin (KEFLEX) 250 MG/5ML suspension Take 5 mLs (250 mg total) by mouth 4 (four) times daily for 5 days. 11/11/22 11/16/22 Yes Colbe Viviano, Michele Rockers, FNP  fluticasone (FLONASE) 50 MCG/ACT nasal spray Place 1 spray into both nostrils daily. 08/17/22   Francene Finders, PA-C    Family History Family History  Problem Relation Age of Onset   Diabetes Maternal Grandmother    Cancer Maternal Grandmother      Social History Social History   Tobacco Use   Smoking status: Former   Smokeless tobacco: Never  Substance Use Topics   Alcohol use: Yes    Comment: socially   Drug use: Not Currently    Frequency: 1.0 times per week    Types: Marijuana     Allergies   Patient has no known allergies.   Review of Systems Review of Systems Per HPI  Physical Exam Triage Vital Signs ED Triage Vitals [11/11/22 1912]  Enc Vitals Group     BP 116/70     Pulse Rate 80     Resp 16     Temp 97.9 F (36.6 C)     Temp Source Oral     SpO2 97 %     Weight      Height      Head Circumference      Peak Flow      Pain Score 0     Pain Loc      Pain Edu?      Excl. in [place]?    No data found.  Updated Vital Signs BP 116/70 (BP Location: Left Arm)   Pulse 80   Temp 97.9 F (36.6 C) (Oral)   Resp 16   SpO2 97%   Visual Acuity Right Eye Distance:   Left Eye Distance:   Bilateral Distance:    Right Eye Near:  Left Eye Near:    Bilateral Near:     Physical Exam Constitutional:      General: She is not in acute distress.    Appearance: Normal appearance. She is not toxic-appearing or diaphoretic.  HENT:     Head: Normocephalic and atraumatic.  Eyes:     Extraocular Movements: Extraocular movements intact.     Conjunctiva/sclera: Conjunctivae normal.  Cardiovascular:     Rate and Rhythm: Normal rate and regular rhythm.     Pulses: Normal pulses.     Heart sounds: Normal heart sounds.  Pulmonary:     Effort: Pulmonary effort is normal. No respiratory distress.     Breath sounds: Normal breath sounds.  Abdominal:     General: Bowel sounds are normal. There is no distension.     Palpations: Abdomen is soft.     Tenderness: There is no abdominal tenderness.  Neurological:     General: No focal deficit present.     Mental Status: She is alert and oriented to person, place, and time. Mental status is at baseline.  Psychiatric:        Mood and Affect: Mood normal.         Behavior: Behavior normal.        Thought Content: Thought content normal.        Judgment: Judgment normal.      UC Treatments / Results  Labs (all labs ordered are listed, but only abnormal results are displayed) Labs Reviewed  POCT URINALYSIS DIP (MANUAL ENTRY) - Abnormal; Notable for the following components:      Result Value   Clarity, UA hazy (*)    Blood, UA small (*)    Leukocytes, UA Trace (*)    All other components within normal limits  URINE CULTURE  POCT URINE PREGNANCY    EKG   Radiology No results found.  Procedures Procedures (including critical care time)  Medications Ordered in UC Medications - No data to display  Initial Impression / Assessment and Plan / UC Course  I have reviewed the triage vital signs and the nursing notes.  Pertinent labs & imaging results that were available during my care of the patient were reviewed by me and considered in my medical decision making (see chart for details).     UA showing trace leukocytes but with associated symptoms, will opt to treat for UTI with cephalexin antibiotic.  Urine culture pending.  Given no concern for STD or vaginitis and no associated vaginal discharge, will defer vaginal swab.  Urine pregnancy negative.  Patient was advised to follow-up if symptoms persist or worsen.  Patient verbalized understanding and was agreeable with plan. Final Clinical Impressions(s) / UC Diagnoses   Final diagnoses:  Acute cystitis with hematuria  Dysuria  Urinary frequency     Discharge Instructions      Pregnancy test was negative.  You have a urinary tract infection which is being treated with an antibiotic.  Urine culture is pending.  Will call if it is abnormal.  Please follow-up if symptoms persist or worsen.    ED Prescriptions     Medication Sig Dispense Auth. Provider   cephALEXin (KEFLEX) 250 MG/5ML suspension Take 5 mLs (250 mg total) by mouth 4 (four) times daily for 5 days. 100 mL Teodora Medici, [place]      PDMP not reviewed this encounter.   Teodora Medici, [place] 11/11/22 2020

## 2022-11-11 NOTE — ED Triage Notes (Signed)
Pt c/o dysuria, hematuria, frequency   Onset ~ Monday   States cannot do pills liquid only

## 2022-11-13 LAB — URINE CULTURE: Culture: NO GROWTH

## 2022-11-19 ENCOUNTER — Ambulatory Visit (INDEPENDENT_AMBULATORY_CARE_PROVIDER_SITE_OTHER): Payer: 59 | Admitting: Licensed Clinical Social Worker

## 2022-11-19 DIAGNOSIS — F4320 Adjustment disorder, unspecified: Secondary | ICD-10-CM | POA: Diagnosis not present

## 2022-11-19 NOTE — Progress Notes (Signed)
Virtual Visit via Video Note  I connected with Gail Thomas on 11/19/22 at 11:00 AM EST by a video enabled telemedicine application and verified that I am speaking with the correct person using two identifiers.  Location: Patient: Home Provider: Office   I discussed the limitations of evaluation and management by telemedicine and the availability of in person appointments. The patient expressed understanding and agreed to proceed.  Comprehensive Clinical Assessment (CCA) Note  11/19/2022 Gail Thomas [id number]  Chief Complaint:  Chief Complaint  Patient presents with   Obesity   Visit Diagnosis: Adjustment disorder, unspecified type     CCA Biopsychosocial Intake/Chief Complaint:  Bariatric surgery  Current Symptoms/Problems: Denies anxiety, denies depression, denies psychosis   Patient Reported Schizophrenia/Schizoaffective Diagnosis in Past: No   Strengths: good communicator, understanding, people person  Preferences: patient is flexible, it depends on the day on her preferences, she is easy going  Abilities: good Metallurgist, make up artist, being a good dialysis tech   Type of Services Patient Feels are Needed: Bariatric Surgery   Initial Clinical Notes/Concerns: Struggles with weight started after her second child. Patient had 2 children back to back. No family history of obesity. Has 3 children, all were C-sections. She recovered well. 2016 has a fiborus lump removed from her leg.   Mental Health Symptoms Depression:   None   Duration of Depressive symptoms: No data recorded  Mania:   None   Anxiety:    None   Psychosis:   None   Duration of Psychotic symptoms: No data recorded  Trauma:   None   Obsessions:   None   Compulsions:   None   Inattention:   None   Hyperactivity/Impulsivity:   None   Oppositional/Defiant Behaviors:   None   Emotional Irregularity:   None   Other Mood/Personality Symptoms:   None    Mental  Status Exam Appearance and self-care  Stature:   Average   Weight:   Obese   Clothing:   Casual   Grooming:   Normal   Cosmetic use:   Excessive   Posture/gait:   Normal   Motor activity:   Not Remarkable   Sensorium  Attention:   Normal   Concentration:   Normal   Orientation:   X5   Recall/memory:   Normal   Affect and Mood  Affect:   Appropriate   Mood:   Euthymic   Relating  Eye contact:   Normal   Facial expression:   Responsive   Attitude toward examiner:   Cooperative   Thought and Language  Speech flow:  Normal   Thought content:   Appropriate to Mood and Circumstances   Preoccupation:   None   Hallucinations:   None   Organization:  No data recorded  [employer] of Knowledge:   Good   Intelligence:   Average   Abstraction:   Normal   Judgement:   Good   Reality Testing:   Realistic   Insight:   Present   Decision Making:   Normal   Social Functioning  Social Maturity:   Responsible   Social Judgement:   Normal   Stress  Stressors:  No data recorded  Coping Ability:   Normal   Skill Deficits:   None   Supports:   Family     Religion:    Leisure/Recreation:    Exercise/Diet:     CCA Employment/Education Employment/Work Situation:    Education:  CCA Family/Childhood History Family and Relationship History:    Childhood History:     Child/Adolescent Assessment:     CCA Substance Use Alcohol/Drug Use:                           ASAM's:  Six Dimensions of Multidimensional Assessment  Dimension 1:  Acute Intoxication and/or Withdrawal Potential:      Dimension 2:  Biomedical Conditions and Complications:      Dimension 3:  Emotional, Behavioral, or Cognitive Conditions and Complications:     Dimension 4:  Readiness to Change:     Dimension 5:  Relapse, Continued use, or Continued Problem Potential:     Dimension 6:  Recovery/Living  Environment:     ASAM Severity Score:    ASAM Recommended Level of Treatment:     Substance use Disorder (SUD)    Recommendations for Services/Supports/Treatments:    DSM5 Diagnoses: Patient Active Problem List   Diagnosis Date Noted   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   Behavioral Health Assessment  Behavioral Health Assessment Patient Name Gail Thomas Date of Birth 1986/04/10  Age 9 Date of Interview 01.12.2023  Gender Female Date of Report 01.12.2023  Purpose Bariatric/Weight-loss Surgery (pre-operative evaluation)     Assessment Instruments:  DSM-5-TR Self-Rated Level 1 Cross-Cutting Symptom Measure--Adult Severity Measure for Generalized Anxiety Disorder--Adult EAT-26  Chief Complain: Obesity  Client Background: Patient is a 37 year old African American female seeking weight loss surgery. Patient has an associate degree in Medical Assisting. She works for [employer] .  Patient is engaged. The patient is 5 feet 2 inches tall and 280 lbs., placing her at a BMI of 51.2 classifying her in the obese range and at further risk of co-morbid diseases.  Weight History: Patient's weight started to become a struggle after her second child. Patient had two children back to back.   Eating Patterns: Patient is focusing on variety, balance, and portion control in her eating. She is focusing on more protein, and less carbs. She is working on eating liquid/smoothie.    Related Medical Issues:   Patient denies other medical issues such as asleep apnea or high blood pressure.    Family History of Obesity:  No family history of obesity.  Tobacco Use: Patient denies tobacco use.   PATIENT BEHAVIORAL ASSESSMENT SCORES  Personal History of Mental Illness: Patient denies treatment for depression and anxiety.   Mental Status Examination: Patient was oriented x5 (person, place, situation, time, and object). She was appropriately groomed, and neatly dressed. Patient was alert, engaged, pleasant, and cooperative. Patient denies suicidal and homicidal ideations. Patient denies self-injury. Patient denies psychosis including auditory and visual hallucinations  DSM-5-TR Self-Rated Level 1 Cross-Cutting Symptom Measure--Adult: Patient rated herself a 0 on the Depression domain indicating no presence of depression. She rated herself a 0 on the anxiety domain indicating no presence of anxiety.   Severity Measure for Generalized Anxiety Disorder--Adult: Patient completed a 10-question scale. Total scores can range from 0 to 40. A raw score is calculated by summing  the answer to each question, and an average total score is achieved by dividing the raw score by the number of items (e.g., 10). Patient had a total raw score of 0 out of 40 which was divided by the total number of questions answered (10) to get an average score of 0 which indicates no significant anxiety.   EAT-26: The EAT-26 is a twenty-six-question screening tool to identify symptoms of eating disorders and disordered eating. The patient scored 1 out of 26. Scores below a 20 are considered not meeting criteria for disordered eating. Patient denies inducing vomiting, or intentional meal skipping. Patient denies binge eating behaviors. Patient denies laxative abuse. Patient does not meet criteria for a DSM-V eating disorder.  Conclusion & Recommendations:  Bristol Lanes's mental health history and current re-assessment indicate that she is suitable for bariatric surgery. Lizzy's bariatric surgery was delayed so that she  could add in short term disability to her insurance to recover without financial stress. No symptoms of mood or anxiety are present. Patient understands the procedure, the risks associated with it, and the importance of post-operative holistic care (Physical, Spiritual/Values, Relationships, and Mental/Emotional health) with access to resources for support as needed. The patient has made an informed decision to proceed with the procedure. The patient is motivated and expressed understanding of the post-surgical requirements. Patient's psychological assessment will be valid from today's date for 6 months (07.12.2024). Then, a follow-up appointment will be needed to re-evaluate the patient's psychological status.   I see no significant psychological factors that would hinder the success of bariatric surgery. I support Raihana Smestad's desire for Bariatric Surgery.   Glori Bickers, LCSW     Referrals to Alternative Service(s): Referred to Alternative Service(s):   Place:   Date:   Time:    Referred to Alternative Service(s):   Place:   Date:   Time:    Referred to Alternative Service(s):   Place:   Date:   Time:    Referred to Alternative Service(s):   Place:   Date:   Time:      Collaboration of Care: Other provider involved in patient's care AEB collaborating with [facility].   Patient/Guardian was advised Release of Information must be obtained prior to any record release in order to collaborate their care with an outside provider. Patient/Guardian was advised if they have not already done so to contact the registration department to sign all necessary forms in order for [place] to release information regarding their care.   Consent: Patient/Guardian gives verbal consent for treatment and assignment of benefits for services provided during this visit. Patient/Guardian expressed understanding and agreed to proceed.   I discussed the assessment and treatment plan with the patient. The  patient was provided an opportunity to ask questions and all were answered. The patient agreed with the plan and demonstrated an understanding of the instructions.   The patient was advised to call back or seek an in-person evaluation if the symptoms worsen or if the condition fails to improve as anticipated.  I provided 25 minutes of non-face-to-face time during this encounter.  Glori Bickers, LCSW

## 2022-12-06 ENCOUNTER — Encounter: Payer: Self-pay | Admitting: Skilled Nursing Facility1

## 2022-12-06 ENCOUNTER — Encounter: Payer: 59 | Attending: General Surgery | Admitting: Skilled Nursing Facility1

## 2022-12-06 VITALS — Wt 284.7 lb

## 2022-12-06 DIAGNOSIS — E669 Obesity, unspecified: Secondary | ICD-10-CM | POA: Diagnosis present

## 2022-12-06 NOTE — Progress Notes (Signed)
Pre-Operative Nutrition Class:    Patient was seen on 12/06/2022 for Pre-Operative Bariatric Surgery Education at [facility].    Surgery date:  Surgery type: sleeve Start weight at [facility]: 274.4 Weight today: 284.7     The following the learning objectives were met by the patient during this course: Identify Pre-Op Dietary Goals and will begin 2 weeks pre-operatively Identify appropriate sources of fluids and proteins  State protein recommendations and appropriate sources pre and post-operatively Identify Post-Operative Dietary Goals and will follow for 2 weeks post-operatively Identify appropriate multivitamin and calcium sources Describe the need for physical activity post-operatively and will follow MD recommendations State when to call healthcare provider regarding medication questions or post-operative complications When having a diagnosis of diabetes understanding hypoglycemia symptoms and the inclusion of 1 complex carbohydrate per meal  Handouts given during class include: Pre-Op Bariatric Surgery Diet Handout Protein Shake Handout Post-Op Bariatric Surgery Nutrition Handout BELT Program Information Flyer Support Group Information Flyer [facility] Bariatric Supplements Price List  Follow-Up Plan: Patient will follow-up at [facility] 2 weeks post operatively for diet advancement per MD.

## 2022-12-22 ENCOUNTER — Ambulatory Visit: Payer: Self-pay | Admitting: General Surgery

## 2023-01-07 NOTE — Progress Notes (Signed)
COVID Vaccine Completed:  Date of COVID positive in last 90 days:  PCP -  Cardiologist -   Chest x-ray - 01-27-22 Epic EKG - 01-27-22 Epic Stress Test -  ECHO -  Cardiac Cath -  Pacemaker/ICD device last checked: Spinal Cord Stimulator:  Bowel Prep -   Sleep Study -  CPAP -   Fasting Blood Sugar -  Checks Blood Sugar _____ times a day  Last dose of GLP1 agonist-  N/A GLP1 instructions:  N/A   Last dose of SGLT-2 inhibitors-  N/A SGLT-2 instructions: N/A   Blood Thinner Instructions: Aspirin Instructions: Last Dose:  Activity level:  Can go up a flight of stairs and perform activities of daily living without stopping and without symptoms of chest pain or shortness of breath.  Able to exercise without symptoms  Unable to go up a flight of stairs without symptoms of     Anesthesia review:   Patient denies shortness of breath, fever, cough and chest pain at PAT appointment  Patient verbalized understanding of instructions that were given to them at the PAT appointment. Patient was also instructed that they will need to review over the PAT instructions again at home before surgery.

## 2023-01-07 NOTE — Patient Instructions (Addendum)
SURGICAL WAITING ROOM VISITATION Patients having surgery or a procedure may have no more than 2 support people in the waiting area - these visitors may rotate.    If the patient needs to stay at the hospital during part of their recovery, the visitor guidelines for inpatient rooms apply. Pre-op nurse will coordinate an appropriate time for 1 support person to accompany patient in pre-op.  This support person may not rotate.    Please refer to the [facility] website for the visitor guidelines for Inpatients (after your surgery is over and you are in a regular room).   Due to an increase in RSV and influenza rates and associated hospitalizations, children ages 35 and under may not visit patients in [place].     Your procedure is scheduled on: 01-17-23   Report to [facility] Main Entrance    Report to admitting at 5:15 AM   Call this number if you have problems the morning of surgery [phone]   Do not eat food :After 6:00 PM the night before surgery .   After Midnight you may have the following liquids until 4:30 AM DAY OF SURGERY  Water Non-Citrus Juices (without pulp, NO RED) Carbonated Beverages Black Coffee (NO MILK/CREAM OR CREAMERS, sugar ok)  Clear Tea (NO MILK/CREAM OR CREAMERS, sugar ok) regular and decaf                             Plain Jell-O (NO RED)                                           Fruit ices (not with fruit pulp, NO RED)                                     Popsicles (NO RED)                                                               Sports drinks like Gatorade (NO RED)                   The day of surgery:  Drink ONE (1) Pre-Surgery G2 at 4:30 AM the morning of surgery. Drink in one sitting. Do not sip.  This drink was given to you during your hospital  pre-op appointment visit. Nothing else to drink after completing the Pre-Surgery G2.          If you have questions, please contact your surgeon's office.   FOLLOW BOWEL  PREP AND ANY ADDITIONAL PRE OP INSTRUCTIONS YOU RECEIVED FROM YOUR SURGEON'S OFFICE!!!     Oral Hygiene is also important to reduce your risk of infection.                                    Remember - BRUSH YOUR TEETH THE MORNING OF SURGERY WITH YOUR REGULAR TOOTHPASTE   Do NOT smoke after Midnight   Take these medicines the morning of surgery with  A SIP OF WATER:   Okay to use Flonase nasal spray                              You may not have any metal on your body including hair pins, jewelry, and body piercing             Do not wear make-up, lotions, powders, perfumes or deodorant  Do not wear nail polish including gel and S&S, artificial/acrylic nails, or any other type of covering on natural nails including finger and toenails. If you have artificial nails, gel coating, etc. that needs to be removed by a nail salon please have this removed prior to surgery or surgery may need to be canceled/ delayed if the surgeon/ anesthesia feels like they are unable to be safely monitored.   Do not shave  48 hours prior to surgery.    Do not bring valuables to the hospital. [name].   Contacts, dentures or bridgework may not be worn into surgery.   Bring small overnight bag day of surgery.   DO NOT [place]. PHARMACY WILL DISPENSE MEDICATIONS LISTED ON YOUR MEDICATION LIST TO YOU DURING YOUR ADMISSION [place]!    If any questions please call [phone] [facility]  If you received a COVID test during your pre-op visit  it is requested that you wear a mask when out in public, stay away from anyone that may not be feeling well and notify your surgeon if you develop symptoms. If you test positive for Covid or have been in contact with anyone that has tested positive in the last 10 days please notify you surgeon.   - Preparing for Surgery Before surgery, you can play an important role.  Because skin is not  sterile, your skin needs to be as free of germs as possible.  You can reduce the number of germs on your skin by washing with CHG (chlorahexidine gluconate) soap before surgery.  CHG is an antiseptic cleaner which kills germs and bonds with the skin to continue killing germs even after washing. Please DO NOT use if you have an allergy to CHG or antibacterial soaps.  If your skin becomes reddened/irritated stop using the CHG and inform your nurse when you arrive at Short Stay. Do not shave (including legs and underarms) for at least 48 hours prior to the first CHG shower.  You may shave your face/neck.  Please follow these instructions carefully:  1.  Shower with CHG Soap the night before surgery and the  morning of surgery.  2.  If you choose to wash your hair, wash your hair first as usual with your normal  shampoo.  3.  After you shampoo, rinse your hair and body thoroughly to remove the shampoo.                             4.  Use CHG as you would any other liquid soap.  You can apply chg directly to the skin and wash.  Gently with a scrungie or clean washcloth.  5.  Apply the CHG Soap to your body ONLY FROM THE NECK DOWN.   Do   not use on face/ open  Wound or open sores. Avoid contact with eyes, ears mouth and   genitals (private parts).                       Wash face,  Genitals (private parts) with your normal soap.             6.  Wash thoroughly, paying special attention to the area where your    surgery  will be performed.  7.  Thoroughly rinse your body with warm water from the neck down.  8.  DO NOT shower/wash with your normal soap after using and rinsing off the CHG Soap.                9.  Pat yourself dry with a clean towel.            10.  Wear clean pajamas.            11.  Place clean sheets on your bed the night of your first shower and do not  sleep with pets. Day of Surgery : Do not apply any lotions/deodorants the morning of surgery.  Please wear  clean clothes to the hospital/surgery center.  FAILURE TO FOLLOW THESE INSTRUCTIONS MAY RESULT IN THE CANCELLATION OF YOUR SURGERY  PATIENT SIGNATURE_________________________________  NURSE SIGNATURE__________________________________  ________________________________________________________________________     Gail Thomas  An incentive spirometer is a tool that can help keep your lungs clear and active. This tool measures how well you are filling your lungs with each breath. Taking long deep breaths may help reverse or decrease the chance of developing breathing (pulmonary) problems (especially infection) following: A long period of time when you are unable to move or be active. BEFORE THE PROCEDURE  If the spirometer includes an indicator to show your best effort, your nurse or respiratory therapist will set it to a desired goal. If possible, sit up straight or lean slightly forward. Try not to slouch. Hold the incentive spirometer in an upright position. INSTRUCTIONS FOR USE  Sit on the edge of your bed if possible, or sit up as far as you can in bed or on a chair. Hold the incentive spirometer in an upright position. Breathe out normally. Place the mouthpiece in your mouth and seal your lips tightly around it. Breathe in slowly and as deeply as possible, raising the piston or the ball toward the top of the column. Hold your breath for 3-5 seconds or for as long as possible. Allow the piston or ball to fall to the bottom of the column. Remove the mouthpiece from your mouth and breathe out normally. Rest for a few seconds and repeat Steps 1 through 7 at least 10 times every 1-2 hours when you are awake. Take your time and take a few normal breaths between deep breaths. The spirometer may include an indicator to show your best effort. Use the indicator as a goal to work toward during each repetition. After each set of 10 deep breaths, practice coughing to be sure your lungs are  clear. If you have an incision (the cut made at the time of surgery), support your incision when coughing by placing a pillow or rolled up towels firmly against it. Once you are able to get out of bed, walk around indoors and cough well. You may stop using the incentive spirometer when instructed by your caregiver.  RISKS AND COMPLICATIONS Take your time so you do not get dizzy or light-headed. If you are in  pain, you may need to take or ask for pain medication before doing incentive spirometry. It is harder to take a deep breath if you are having pain. AFTER USE Rest and breathe slowly and easily. It can be helpful to keep track of a log of your progress. Your caregiver can provide you with a simple table to help with this. If you are using the spirometer at home, follow these instructions: [place] IF:  You are having difficultly using the spirometer. You have trouble using the spirometer as often as instructed. Your pain medication is not giving enough relief while using the spirometer. You develop fever of 100.5 F (38.1 C) or higher. SEEK IMMEDIATE MEDICAL CARE IF:  You cough up bloody sputum that had not been present before. You develop fever of 102 F (38.9 C) or greater. You develop worsening pain at or near the incision site. MAKE SURE YOU:  Understand these instructions. Will watch your condition. Will get help right away if you are not doing well or get worse. Document Released: 03/07/2007 Document Revised: 01/17/2012 Document Reviewed: 05/08/2007 Exit[employer] Patient Information 2014 [employer], [place].   ________________________________________________________________________  WHAT IS A BLOOD TRANSFUSION? Blood Transfusion Information  A transfusion is the replacement of blood or some of its parts. Blood is made up of multiple cells which provide different functions. Red blood cells carry oxygen and are used for blood loss replacement. White blood cells fight against  infection. Platelets control bleeding. Plasma helps clot blood. Other blood products are available for specialized needs, such as hemophilia or other clotting disorders. BEFORE THE TRANSFUSION  Who gives blood for transfusions?  Healthy volunteers who are fully evaluated to make sure their blood is safe. This is blood bank blood. Transfusion therapy is the safest it has ever been in the practice of medicine. Before blood is taken from a donor, a complete history is taken to make sure that person has no history of diseases nor engages in risky social behavior (examples are intravenous drug use or sexual activity with multiple partners). The donor's travel history is screened to minimize risk of transmitting infections, such as malaria. The donated blood is tested for signs of infectious diseases, such as HIV and hepatitis. The blood is then tested to be sure it is compatible with you in order to minimize the chance of a transfusion reaction. If you or a relative donates blood, this is often done in anticipation of surgery and is not appropriate for emergency situations. It takes many days to process the donated blood. RISKS AND COMPLICATIONS Although transfusion therapy is very safe and saves many lives, the main dangers of transfusion include:  Getting an infectious disease. Developing a transfusion reaction. This is an allergic reaction to something in the blood you were given. Every precaution is taken to prevent this. The decision to have a blood transfusion has been considered carefully by your caregiver before blood is given. Blood is not given unless the benefits outweigh the risks. AFTER THE TRANSFUSION Right after receiving a blood transfusion, you will usually feel much better and more energetic. This is especially true if your red blood cells have gotten low (anemic). The transfusion raises the level of the red blood cells which carry oxygen, and this usually causes an energy increase. The  nurse administering the transfusion will monitor you carefully for complications. HOME CARE INSTRUCTIONS  No special instructions are needed after a transfusion. You may find your energy is better. Speak with your caregiver about any  limitations on activity for underlying diseases you may have. SEEK MEDICAL CARE IF:  Your condition is not improving after your transfusion. You develop redness or irritation at the intravenous (IV) site. SEEK IMMEDIATE MEDICAL CARE IF:  Any of the following symptoms occur over the next 12 hours: Shaking chills. You have a temperature by mouth above 102 F (38.9 C), not controlled by medicine. Chest, back, or muscle pain. People around you feel you are not acting correctly or are confused. Shortness of breath or difficulty breathing. Dizziness and fainting. You get a rash or develop hives. You have a decrease in urine output. Your urine turns a dark color or changes to pink, red, or brown. Any of the following symptoms occur over the next 10 days: You have a temperature by mouth above 102 F (38.9 C), not controlled by medicine. Shortness of breath. Weakness after normal activity. The white part of the eye turns yellow (jaundice). You have a decrease in the amount of urine or are urinating less often. Your urine turns a dark color or changes to pink, red, or brown. Document Released: 10/22/2000 Document Revised: 01/17/2012 Document Reviewed: 06/10/2008 [facility] Patient Information 2014 [place], [place].  _______________________________________________________________________

## 2023-01-10 ENCOUNTER — Encounter (HOSPITAL_COMMUNITY): Payer: Self-pay

## 2023-01-10 ENCOUNTER — Encounter (HOSPITAL_COMMUNITY)
Admission: RE | Admit: 2023-01-10 | Discharge: 2023-01-10 | Disposition: A | Discharge status: Home / Self Care | Payer: 59 | Source: Ambulatory Visit | Attending: General Surgery | Admitting: General Surgery

## 2023-01-10 ENCOUNTER — Other Ambulatory Visit: Payer: Self-pay

## 2023-01-10 DIAGNOSIS — Z01812 Encounter for preprocedural laboratory examination: Secondary | ICD-10-CM | POA: Insufficient documentation

## 2023-01-10 LAB — CBC WITH DIFFERENTIAL/PLATELET
Abs Immature Granulocytes: 0.02 10*3/uL (ref 0.00–0.07)
Basophils Absolute: 0 10*3/uL (ref 0.0–0.1)
Basophils Relative: 0 %
Eosinophils Absolute: 0.1 10*3/uL (ref 0.0–0.5)
Eosinophils Relative: 1 %
HCT: 40.9 % (ref 36.0–46.0)
Hemoglobin: 13.2 g/dL (ref 12.0–15.0)
Immature Granulocytes: 0 %
Lymphocytes Relative: 38 %
Lymphs Abs: 3 10*3/uL (ref 0.7–4.0)
MCH: 29.4 pg (ref 26.0–34.0)
MCHC: 32.3 g/dL (ref 30.0–36.0)
MCV: 91.1 fL (ref 80.0–100.0)
Monocytes Absolute: 0.8 10*3/uL (ref 0.1–1.0)
Monocytes Relative: 10 %
Neutro Abs: 4 10*3/uL (ref 1.7–7.7)
Neutrophils Relative %: 51 %
Platelets: 327 10*3/uL (ref 150–400)
RBC: 4.49 MIL/uL (ref 3.87–5.11)
RDW: 12.6 % (ref 11.5–15.5)
WBC: 8 10*3/uL (ref 4.0–10.5)
nRBC: 0 % (ref 0.0–0.2)

## 2023-01-10 LAB — COMPREHENSIVE METABOLIC PANEL
ALT: 19 U/L (ref 0–44)
AST: 19 U/L (ref 15–41)
Albumin: 3.5 g/dL (ref 3.5–5.0)
Alkaline Phosphatase: 66 U/L (ref 38–126)
Anion gap: 6 (ref 5–15)
BUN: 9 mg/dL (ref 6–20)
CO2: 24 mmol/L (ref 22–32)
Calcium: 8.8 mg/dL — ABNORMAL LOW (ref 8.9–10.3)
Chloride: 106 mmol/L (ref 98–111)
Creatinine, Ser: 0.8 mg/dL (ref 0.44–1.00)
GFR, Estimated: >60 mL/min (ref >60)
Glucose, Bld: 69 mg/dL — ABNORMAL LOW (ref 70–99)
Potassium: 3.4 mmol/L — ABNORMAL LOW (ref 3.5–5.1)
Sodium: 136 mmol/L (ref 135–145)
Total Bilirubin: 0.5 mg/dL (ref 0.3–1.2)
Total Protein: 6.6 g/dL (ref 6.5–8.1)

## 2023-01-14 NOTE — Anesthesia Preprocedure Evaluation (Signed)
Anesthesia Evaluation  Patient identified by MRN, date of birth, ID band Patient awake    Reviewed: Allergy & Precautions, NPO status , Patient's Chart, lab work & pertinent test results  Airway Mallampati: III  TM Distance: >3 FB Neck ROM: Full    Dental no notable dental hx.    Pulmonary neg pulmonary ROS   Pulmonary exam normal        Cardiovascular negative cardio ROS  Rhythm:Regular Rate:Normal     Neuro/Psych negative neurological ROS  negative psych ROS   GI/Hepatic Neg liver ROS,,,  Endo/Other    Morbid obesity  Renal/GU negative Renal ROS  negative genitourinary   Musculoskeletal negative musculoskeletal ROS (+)    Abdominal Normal abdominal exam  (+)   Peds  Hematology Lab Results      Component                Value               Date                      WBC                      8.0                 01/10/2023                HGB                      13.2                01/10/2023                HCT                      40.9                01/10/2023                MCV                      91.1                01/10/2023                PLT                      327                 01/10/2023             Lab Results      Component                Value               Date                      NA                       136                 01/10/2023                K  3.4 (L)             01/10/2023                CO2                      24                  01/10/2023                GLUCOSE                  69 (L)              01/10/2023                BUN                      9                   01/10/2023                CREATININE               0.80                01/10/2023                CALCIUM                  8.8 (L)             01/10/2023                GFRNONAA                 >60                 01/10/2023              Anesthesia Other Findings   Reproductive/Obstetrics                              Anesthesia Physical Anesthesia Plan  ASA: 3  Anesthesia Plan: General   Post-op Pain Management: Tylenol PO (pre-op)* and Lidocaine infusion*   Induction: Intravenous and Rapid sequence  PONV Risk Score and Plan: 3 and Ondansetron, Dexamethasone, Midazolam and Treatment may vary due to age or medical condition  Airway Management Planned: Mask and Oral ETT  Additional Equipment: None  Intra-op Plan:   Post-operative Plan: Extubation in OR  Informed Consent: I have reviewed the patients History and Physical, chart, labs and discussed the procedure including the risks, benefits and alternatives for the proposed anesthesia with the patient or authorized representative who has indicated his/her understanding and acceptance.     Dental advisory given  Plan Discussed with: CRNA  Anesthesia Plan Comments:        Anesthesia Quick Evaluation

## 2023-01-17 ENCOUNTER — Other Ambulatory Visit: Payer: Self-pay

## 2023-01-17 ENCOUNTER — Encounter (HOSPITAL_COMMUNITY): Payer: Self-pay | Admitting: General Surgery

## 2023-01-17 ENCOUNTER — Encounter (HOSPITAL_COMMUNITY)
Admission: RE | Disposition: A | Discharge status: Home / Self Care | Payer: Self-pay | Source: Ambulatory Visit | Attending: General Surgery

## 2023-01-17 ENCOUNTER — Ambulatory Visit: Admit: 2023-01-17 | Payer: 59 | Admitting: General Surgery

## 2023-01-17 ENCOUNTER — Ambulatory Visit (HOSPITAL_BASED_OUTPATIENT_CLINIC_OR_DEPARTMENT_OTHER): Payer: 59 | Admitting: Anesthesiology

## 2023-01-17 ENCOUNTER — Ambulatory Visit (HOSPITAL_COMMUNITY): Payer: 59 | Admitting: Anesthesiology

## 2023-01-17 ENCOUNTER — Observation Stay (HOSPITAL_COMMUNITY)
Admission: RE | Admit: 2023-01-17 | Discharge: 2023-01-18 | Disposition: A | Discharge status: Home / Self Care | Payer: 59 | Source: Ambulatory Visit | Attending: General Surgery | Admitting: General Surgery

## 2023-01-17 DIAGNOSIS — E669 Obesity, unspecified: Secondary | ICD-10-CM | POA: Diagnosis present

## 2023-01-17 DIAGNOSIS — Z6841 Body Mass Index (BMI) 40.0 and over, adult: Secondary | ICD-10-CM | POA: Diagnosis not present

## 2023-01-17 HISTORY — PX: LAPAROSCOPIC GASTRIC SLEEVE RESECTION: SHX5895

## 2023-01-17 HISTORY — PX: UPPER GI ENDOSCOPY: SHX6162

## 2023-01-17 LAB — TYPE AND SCREEN
ABO/RH(D): O NEG
Antibody Screen: NEGATIVE

## 2023-01-17 LAB — CBC
HCT: 40.4 % (ref 36.0–46.0)
Hemoglobin: 12.9 g/dL (ref 12.0–15.0)
MCH: 28.7 pg (ref 26.0–34.0)
MCHC: 31.9 g/dL (ref 30.0–36.0)
MCV: 90 fL (ref 80.0–100.0)
Platelets: 305 10*3/uL (ref 150–400)
RBC: 4.49 MIL/uL (ref 3.87–5.11)
RDW: 12.7 % (ref 11.5–15.5)
WBC: 8.4 10*3/uL (ref 4.0–10.5)
nRBC: 0 % (ref 0.0–0.2)

## 2023-01-17 LAB — CREATININE, SERUM
Creatinine, Ser: 0.73 mg/dL (ref 0.44–1.00)
GFR, Estimated: >60 mL/min (ref >60)

## 2023-01-17 LAB — HEMOGLOBIN AND HEMATOCRIT, BLOOD
HCT: 42.7 % (ref 36.0–46.0)
Hemoglobin: 13.7 g/dL (ref 12.0–15.0)

## 2023-01-17 SURGERY — GASTRECTOMY, SLEEVE, LAPAROSCOPIC
Anesthesia: General | Site: Esophagus

## 2023-01-17 SURGERY — GASTRECTOMY, SLEEVE, LAPAROSCOPIC
Anesthesia: General

## 2023-01-17 MED ORDER — CHLORHEXIDINE GLUCONATE 0.12 % MT SOLN
15.0000 mL | Freq: Once | OROMUCOSAL | Status: AC
  Administered 2023-01-17: 15 mL via OROMUCOSAL

## 2023-01-17 MED ORDER — ONDANSETRON HCL 4 MG/2ML IJ SOLN
INTRAMUSCULAR | Status: AC
  Filled 2023-01-17: qty 2

## 2023-01-17 MED ORDER — HEPARIN SODIUM (PORCINE) 5000 UNIT/ML IJ SOLN
5000.0000 [IU] | INTRAMUSCULAR | Status: AC
  Administered 2023-01-17: 5000 [IU] via SUBCUTANEOUS
  Filled 2023-01-17: qty 1

## 2023-01-17 MED ORDER — OXYCODONE HCL 5 MG/5ML PO SOLN
5.0000 mg | Freq: Four times a day (QID) | ORAL | Status: DC | PRN
  Administered 2023-01-17 – 2023-01-18 (×4): 5 mg via ORAL
  Filled 2023-01-17 (×4): qty 5

## 2023-01-17 MED ORDER — LACTATED RINGERS IV SOLN: INTRAVENOUS | Status: DC

## 2023-01-17 MED ORDER — DEXAMETHASONE SODIUM PHOSPHATE 4 MG/ML IJ SOLN: 4.0000 mg | INTRAMUSCULAR | Status: DC

## 2023-01-17 MED ORDER — DEXMEDETOMIDINE HCL IN NACL 80 MCG/20ML IV SOLN
INTRAVENOUS | Status: AC
  Filled 2023-01-17: qty 20

## 2023-01-17 MED ORDER — ACETAMINOPHEN 500 MG PO TABS
1000.0000 mg | ORAL_TABLET | ORAL | Status: AC
  Administered 2023-01-17: 1000 mg via ORAL
  Filled 2023-01-17: qty 2

## 2023-01-17 MED ORDER — DEXAMETHASONE SODIUM PHOSPHATE 10 MG/ML IJ SOLN
INTRAMUSCULAR | Status: AC
  Filled 2023-01-17: qty 1

## 2023-01-17 MED ORDER — MORPHINE SULFATE (PF) 2 MG/ML IV SOLN: 1.0000 mg | INTRAVENOUS | Status: DC | PRN

## 2023-01-17 MED ORDER — DEXTROSE-NACL 5-0.45 % IV SOLN: INTRAVENOUS | Status: DC

## 2023-01-17 MED ORDER — HYDRALAZINE HCL 20 MG/ML IJ SOLN: 10.0000 mg | INTRAMUSCULAR | Status: DC | PRN

## 2023-01-17 MED ORDER — ONDANSETRON HCL 4 MG/2ML IJ SOLN
4.0000 mg | INTRAMUSCULAR | Status: DC | PRN
  Administered 2023-01-18: 4 mg via INTRAVENOUS
  Filled 2023-01-17: qty 2

## 2023-01-17 MED ORDER — ACETAMINOPHEN 500 MG PO TABS: 1000.0000 mg | ORAL_TABLET | Freq: Once | ORAL | Status: DC

## 2023-01-17 MED ORDER — ROCURONIUM BROMIDE 10 MG/ML (PF) SYRINGE
PREFILLED_SYRINGE | INTRAVENOUS | Status: AC
  Filled 2023-01-17: qty 10

## 2023-01-17 MED ORDER — LIDOCAINE HCL 2 % IJ SOLN
INTRAMUSCULAR | Status: AC
  Filled 2023-01-17: qty 20

## 2023-01-17 MED ORDER — LIDOCAINE 2% (20 MG/ML) 5 ML SYRINGE
INTRAMUSCULAR | Status: DC | PRN
  Administered 2023-01-17: 60 mg via INTRAVENOUS

## 2023-01-17 MED ORDER — MIDAZOLAM HCL 5 MG/5ML IJ SOLN
INTRAMUSCULAR | Status: DC | PRN
  Administered 2023-01-17: 2 mg via INTRAVENOUS

## 2023-01-17 MED ORDER — DEXAMETHASONE SODIUM PHOSPHATE 10 MG/ML IJ SOLN
INTRAMUSCULAR | Status: DC | PRN
  Administered 2023-01-17: 10 mg via INTRAVENOUS

## 2023-01-17 MED ORDER — STERILE WATER FOR IRRIGATION IR SOLN
Status: DC | PRN
  Administered 2023-01-17: 1000 mL

## 2023-01-17 MED ORDER — 0.9 % SODIUM CHLORIDE (POUR BTL) OPTIME
TOPICAL | Status: DC | PRN
  Administered 2023-01-17: 1000 mL

## 2023-01-17 MED ORDER — BUPIVACAINE LIPOSOME 1.3 % IJ SUSP
INTRAMUSCULAR | Status: AC
  Filled 2023-01-17: qty 20

## 2023-01-17 MED ORDER — FENTANYL CITRATE PF 50 MCG/ML IJ SOSY
PREFILLED_SYRINGE | INTRAMUSCULAR | Status: AC
  Administered 2023-01-17: 50 ug via INTRAVENOUS
  Filled 2023-01-17: qty 2

## 2023-01-17 MED ORDER — ENSURE MAX PROTEIN PO LIQD
2.0000 [oz_av] | ORAL | Status: DC
  Administered 2023-01-18 (×4): 2 [oz_av] via ORAL

## 2023-01-17 MED ORDER — FENTANYL CITRATE (PF) 100 MCG/2ML IJ SOLN
INTRAMUSCULAR | Status: DC | PRN
  Administered 2023-01-17 (×2): 50 ug via INTRAVENOUS
  Administered 2023-01-17: 100 ug via INTRAVENOUS

## 2023-01-17 MED ORDER — APREPITANT 40 MG PO CAPS
40.0000 mg | ORAL_CAPSULE | ORAL | Status: AC
  Administered 2023-01-17: 40 mg via ORAL
  Filled 2023-01-17: qty 1

## 2023-01-17 MED ORDER — BUPIVACAINE LIPOSOME 1.3 % IJ SUSP: 20.0000 mL | Freq: Once | INTRAMUSCULAR | Status: DC

## 2023-01-17 MED ORDER — ONDANSETRON HCL 4 MG/2ML IJ SOLN
INTRAMUSCULAR | Status: DC | PRN
  Administered 2023-01-17: 4 mg via INTRAVENOUS

## 2023-01-17 MED ORDER — BUPIVACAINE HCL 0.25 % IJ SOLN
INTRAMUSCULAR | Status: AC
  Filled 2023-01-17: qty 1

## 2023-01-17 MED ORDER — FENTANYL CITRATE (PF) 100 MCG/2ML IJ SOLN
INTRAMUSCULAR | Status: AC
  Filled 2023-01-17: qty 2

## 2023-01-17 MED ORDER — ORAL CARE MOUTH RINSE: 15.0000 mL | Freq: Once | OROMUCOSAL | Status: AC

## 2023-01-17 MED ORDER — SCOPOLAMINE 1 MG/3DAYS TD PT72
1.0000 | MEDICATED_PATCH | TRANSDERMAL | Status: DC
  Administered 2023-01-17: 1.5 mg via TRANSDERMAL
  Filled 2023-01-17: qty 1

## 2023-01-17 MED ORDER — LIDOCAINE HCL (PF) 2 % IJ SOLN
INTRAMUSCULAR | Status: DC | PRN
  Administered 2023-01-17: 1 mg/kg/h via INTRADERMAL

## 2023-01-17 MED ORDER — PROPOFOL 10 MG/ML IV BOLUS
INTRAVENOUS | Status: DC | PRN
  Administered 2023-01-17: 200 mg via INTRAVENOUS

## 2023-01-17 MED ORDER — MIDAZOLAM HCL 2 MG/2ML IJ SOLN
INTRAMUSCULAR | Status: AC
  Filled 2023-01-17: qty 2

## 2023-01-17 MED ORDER — DEXMEDETOMIDINE HCL IN NACL 80 MCG/20ML IV SOLN
INTRAVENOUS | Status: DC | PRN
  Administered 2023-01-17: 4 ug via BUCCAL
  Administered 2023-01-17: 12 ug via BUCCAL
  Administered 2023-01-17: 4 ug via BUCCAL

## 2023-01-17 MED ORDER — CHLORHEXIDINE GLUCONATE CLOTH 2 % EX PADS: 6.0000 | MEDICATED_PAD | Freq: Once | CUTANEOUS | Status: DC

## 2023-01-17 MED ORDER — BUPIVACAINE LIPOSOME 1.3 % IJ SUSP
INTRAMUSCULAR | Status: DC | PRN
  Administered 2023-01-17: 20 mL

## 2023-01-17 MED ORDER — BUPIVACAINE HCL 0.25 % IJ SOLN
INTRAMUSCULAR | Status: DC | PRN
  Administered 2023-01-17: 30 mL

## 2023-01-17 MED ORDER — ACETAMINOPHEN 500 MG PO TABS
1000.0000 mg | ORAL_TABLET | Freq: Three times a day (TID) | ORAL | Status: DC
  Administered 2023-01-17: 1000 mg via ORAL
  Filled 2023-01-17: qty 2

## 2023-01-17 MED ORDER — FENTANYL CITRATE PF 50 MCG/ML IJ SOSY
25.0000 ug | PREFILLED_SYRINGE | INTRAMUSCULAR | Status: DC | PRN
  Administered 2023-01-17: 50 ug via INTRAVENOUS

## 2023-01-17 MED ORDER — SODIUM CHLORIDE 0.9 % IV SOLN
2.0000 g | INTRAVENOUS | Status: AC
  Administered 2023-01-17: 2 g via INTRAVENOUS
  Filled 2023-01-17: qty 2

## 2023-01-17 MED ORDER — SIMETHICONE 80 MG PO CHEW
80.0000 mg | CHEWABLE_TABLET | Freq: Four times a day (QID) | ORAL | Status: DC | PRN
  Administered 2023-01-17 – 2023-01-18 (×3): 80 mg via ORAL
  Filled 2023-01-17 (×3): qty 1

## 2023-01-17 MED ORDER — SUGAMMADEX SODIUM 200 MG/2ML IV SOLN
INTRAVENOUS | Status: DC | PRN
  Administered 2023-01-17: 200 mg via INTRAVENOUS

## 2023-01-17 MED ORDER — HEPARIN SODIUM (PORCINE) 5000 UNIT/ML IJ SOLN
5000.0000 [IU] | Freq: Three times a day (TID) | INTRAMUSCULAR | Status: DC
  Administered 2023-01-17 – 2023-01-18 (×2): 5000 [IU] via SUBCUTANEOUS
  Filled 2023-01-17 (×2): qty 1

## 2023-01-17 MED ORDER — LACTATED RINGERS IR SOLN
Status: DC | PRN
  Administered 2023-01-17: 1000 mL

## 2023-01-17 MED ORDER — ROCURONIUM BROMIDE 10 MG/ML (PF) SYRINGE
PREFILLED_SYRINGE | INTRAVENOUS | Status: DC | PRN
  Administered 2023-01-17: 60 mg via INTRAVENOUS
  Administered 2023-01-17: 10 mg via INTRAVENOUS

## 2023-01-17 MED ORDER — ACETAMINOPHEN 160 MG/5ML PO SOLN
1000.0000 mg | Freq: Three times a day (TID) | ORAL | Status: DC
  Administered 2023-01-18: 1000 mg via ORAL
  Filled 2023-01-17 (×2): qty 40.6

## 2023-01-17 MED ORDER — PROPOFOL 10 MG/ML IV BOLUS
INTRAVENOUS | Status: AC
  Filled 2023-01-17: qty 20

## 2023-01-17 MED ORDER — FAMOTIDINE IN NACL 20-0.9 MG/50ML-% IV SOLN
20.0000 mg | Freq: Two times a day (BID) | INTRAVENOUS | Status: DC
  Administered 2023-01-17 (×2): 20 mg via INTRAVENOUS
  Filled 2023-01-17 (×2): qty 50

## 2023-01-17 SURGICAL SUPPLY — 66 items
ANTIFOG SOL W/FOAM PAD STRL (MISCELLANEOUS) ×2
APL PRP STRL LF DISP 70% ISPRP (MISCELLANEOUS) ×2
APL SKNCLS STERI-STRIP NONHPOA (GAUZE/BANDAGES/DRESSINGS) ×2
APPLIER CLIP ROT 13.4 12 LRG (CLIP) ×2
APR CLP LRG 13.4X12 ROT 20 MLT (CLIP) ×2
BAG COUNTER SPONGE SURGICOUNT (BAG) IMPLANT
BAG LAPAROSCOPIC 12 15 PORT 16 (BASKET) ×2 IMPLANT
BAG RETRIEVAL 12/15 (BASKET) ×4
BAG SPNG CNTER NS LX DISP (BAG)
BENZOIN TINCTURE PRP APPL 2/3 (GAUZE/BANDAGES/DRESSINGS) ×2 IMPLANT
BLADE SURG SZ11 CARB STEEL (BLADE) ×2 IMPLANT
BNDG ADH 1X3 SHEER STRL LF (GAUZE/BANDAGES/DRESSINGS) ×12 IMPLANT
BNDG ADH THN 3X1 STRL LF (GAUZE/BANDAGES/DRESSINGS) ×12
CABLE HIGH FREQUENCY MONO STRZ (ELECTRODE) IMPLANT
CHLORAPREP W/TINT 26 (MISCELLANEOUS) ×2 IMPLANT
CLIP APPLIE ROT 13.4 12 LRG (CLIP) IMPLANT
COVER SURGICAL LIGHT HANDLE (MISCELLANEOUS) ×2 IMPLANT
DRAPE UTILITY XL STRL (DRAPES) ×4 IMPLANT
ELECT REM PT RETURN 15FT ADLT (MISCELLANEOUS) ×2 IMPLANT
GAUZE 4X4 16PLY ~~LOC~~+RFID DBL (SPONGE) ×2 IMPLANT
GLOVE BIOGEL PI IND STRL 7.0 (GLOVE) ×2 IMPLANT
GLOVE SURG SS PI 7.0 STRL IVOR (GLOVE) ×2 IMPLANT
GOWN STRL REUS W/ TWL LRG LVL3 (GOWN DISPOSABLE) ×2 IMPLANT
GOWN STRL REUS W/ TWL XL LVL3 (GOWN DISPOSABLE) IMPLANT
GOWN STRL REUS W/TWL LRG LVL3 (GOWN DISPOSABLE) ×2
GOWN STRL REUS W/TWL XL LVL3 (GOWN DISPOSABLE)
GRASPER SUT TROCAR 14GX15 (MISCELLANEOUS) ×2 IMPLANT
IRRIG SUCT STRYKERFLOW 2 WTIP (MISCELLANEOUS) ×2
IRRIGATION SUCT STRKRFLW 2 WTP (MISCELLANEOUS) ×2 IMPLANT
KIT BASIN OR (CUSTOM PROCEDURE TRAY) ×2 IMPLANT
KIT TURNOVER KIT A (KITS) IMPLANT
MARKER SKIN DUAL TIP RULER LAB (MISCELLANEOUS) ×2 IMPLANT
MAT PREVALON FULL STRYKER (MISCELLANEOUS) IMPLANT
NDL SPNL 22GX3.5 QUINCKE BK (NEEDLE) ×2 IMPLANT
NEEDLE SPNL 22GX3.5 QUINCKE BK (NEEDLE) ×2 IMPLANT
PACK UNIVERSAL I (CUSTOM PROCEDURE TRAY) ×2 IMPLANT
RELOAD STAPLE 60 3.6 BLU REG (STAPLE) IMPLANT
RELOAD STAPLE 60 3.8 GOLD REG (STAPLE) IMPLANT
RELOAD STAPLE 60 4.1 GRN THCK (STAPLE) IMPLANT
RELOAD STAPLER BLUE 60MM (STAPLE) ×6 IMPLANT
RELOAD STAPLER GOLD 60MM (STAPLE) ×4 IMPLANT
RELOAD STAPLER GREEN 60MM (STAPLE) IMPLANT
SCISSORS LAP 5X45 EPIX DISP (ENDOMECHANICALS) IMPLANT
SET TUBE SMOKE EVAC HIGH FLOW (TUBING) ×2 IMPLANT
SHEARS HARMONIC ACE PLUS 45CM (MISCELLANEOUS) ×2 IMPLANT
SLEEVE GASTRECTOMY 40FR VISIGI (MISCELLANEOUS) ×2 IMPLANT
SLEEVE Z-THREAD 5X100MM (TROCAR) ×4 IMPLANT
SOLUTION ANTFG W/FOAM PAD STRL (MISCELLANEOUS) ×2 IMPLANT
SPIKE FLUID TRANSFER (MISCELLANEOUS) ×2 IMPLANT
STAPLER ECHELON LONG 3000 60 (ENDOMECHANICALS) ×2 IMPLANT
STAPLER ECHELON LONG 60 440 (INSTRUMENTS) ×2 IMPLANT
STAPLER RELOAD BLUE 60MM (STAPLE) ×6
STAPLER RELOAD GOLD 60MM (STAPLE) ×4
STAPLER RELOAD GREEN 60MM (STAPLE)
STRIP CLOSURE SKIN 1/2X4 (GAUZE/BANDAGES/DRESSINGS) ×2 IMPLANT
SUT ETHIBOND 0 36 GRN (SUTURE) IMPLANT
SUT MNCRL AB 4-0 PS2 18 (SUTURE) ×2 IMPLANT
SUT VICRYL 0 TIES 12 18 (SUTURE) ×2 IMPLANT
SYR 20ML LL LF (SYRINGE) ×2 IMPLANT
SYR 50ML LL SCALE MARK (SYRINGE) ×2 IMPLANT
SYS KII OPTICAL ACCESS 15MM (TROCAR) ×2
SYSTEM KII OPTICAL ACCESS 15MM (TROCAR) ×2 IMPLANT
TOWEL OR 17X26 10 PK STRL BLUE (TOWEL DISPOSABLE) ×2 IMPLANT
TOWEL OR NON WOVEN STRL DISP B (DISPOSABLE) ×2 IMPLANT
TROCAR Z-THREAD OPTICAL 5X100M (TROCAR) ×2 IMPLANT
TUBING CONNECTING 10 (TUBING) ×4 IMPLANT

## 2023-01-17 NOTE — Transfer of Care (Signed)
Immediate Anesthesia Transfer of Care Note  Patient: Gail Thomas  Procedure(s) Performed: Procedure(s): LAPAROSCOPIC SLEEVE GASTRECTOMY (N/A) UPPER GI ENDOSCOPY (N/A)  Patient Location: PACU  Anesthesia Type:General  Level of Consciousness: Alert, Awake, Oriented  Airway & Oxygen Therapy: Patient Spontanous Breathing  Post-op Assessment: Report given to RN  Post vital signs: Reviewed and stable  Last Vitals:  Vitals:   01/17/23 0639 01/17/23 0906  BP: 128/73 (!) 145/82  Pulse: 74   Resp: 16   Temp: 36.9 C   SpO2: 123XX123     Complications: No apparent anesthesia complications

## 2023-01-17 NOTE — Anesthesia Procedure Notes (Signed)
Procedure Name: Intubation Date/Time: 01/17/2023 7:42 AM  Performed by: Gerald Leitz, CRNAPre-anesthesia Checklist: Patient identified, Patient being monitored, Timeout performed, Emergency Drugs available and Suction available Patient Re-evaluated:Patient Re-evaluated prior to induction Oxygen Delivery Method: Circle system utilized Preoxygenation: Pre-oxygenation with 100% oxygen Induction Type: IV induction Ventilation: Mask ventilation without difficulty Laryngoscope Size: Mac and 3 Grade View: Grade I Tube type: Oral Tube size: 7.0 mm Number of attempts: 1 Airway Equipment and Method: Stylet Placement Confirmation: ETT inserted through vocal cords under direct vision, positive ETCO2 and breath sounds checked- equal and bilateral Secured at: 21 cm Tube secured with: Tape Dental Injury: Teeth and Oropharynx as per pre-operative assessment

## 2023-01-17 NOTE — H&P (Signed)
History of Present Illness: Gail Thomas is a 37 y.o. female who is seen today for bariatric preop.  She has one more visit for psych. She has completed all other requirements. She denies new symptoms or medications  Review of Systems: A complete review of systems was obtained from the patient. I have reviewed this information and discussed as appropriate with the patient. See HPI as well for other ROS.  Review of Systems  Constitutional: Negative.  HENT: Negative.  Eyes: Negative.  Respiratory: Negative.  Cardiovascular: Negative.  Gastrointestinal: Negative.  Genitourinary: Negative.  Musculoskeletal: Negative.  Skin: Negative.  Neurological: Negative.  Endo/Heme/Allergies: Negative.  Psychiatric/Behavioral: Negative.    Medical History: History reviewed. No pertinent past medical history.  There is no problem list on file for this patient.  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 Marla Roe  CESAREAN SECTION  2011, 2019, 2020    No Known Allergies  No current outpatient medications on file prior to visit.   No current facility-administered medications on file prior to visit.   Family History  Problem Relation Age of Onset  Diabetes Maternal Grandmother    Social History   Tobacco Use  Smoking Status Never  Smokeless Tobacco Never    Social History   Socioeconomic History  Marital status: Single  Tobacco Use  Smoking status: Never  Smokeless tobacco: Never  Substance and Sexual Activity  Alcohol use: Never  Drug use: Never   Objective:   Vitals:  11/10/22 1003  BP: 124/80  Pulse: 96  Temp: 37.2 C (99 F)  SpO2: 97%  Weight: (!) 129.7 kg (286 lb)  Height: 157.5 cm ('5\' 2"'$ )  PainSc: 0-No pain   Body mass index is 52.31 kg/m.  Physical Exam Constitutional:  Appearance: Normal appearance.  HENT:  Head: Normocephalic and atraumatic.  Pulmonary:  Effort:  Pulmonary effort is normal.  Musculoskeletal:  General: Normal range of motion.  Cervical back: Normal range of motion.  Neurological:  General: No focal deficit present.  Mental Status: She is alert and oriented to person, place, and time. Mental status is at baseline.  Psychiatric:  Mood and Affect: Mood normal.  Behavior: Behavior normal.  Thought Content: Thought content normal.   Labs, Imaging and Diagnostic Testing:  I reviewed notes by Sandie Ano  Assessment and Plan:   Diagnoses and all orders for this visit:  Morbid (severe) obesity due to excess calories (CMS-HCC)    The patient meets weight loss surgery criteria. Due to the above reasons, I think minimally invasive vertical sleeve gastrectomy is the best option for the patient.   We discussed sleeve gastrectomy. We discussed the preoperative, operative and postoperative process. I explained the surgery in detail including the performance of an EGD near the end of the surgery to test for leak. We discussed the typical hospital course including a 1-2 day stay baring any complications. The patient was given educational material. I quoted the patient that most patients can lose up to 50-70% of their excess weight. We did discuss the possibility of weight regain several years after the procedure.   The risks of infection, bleeding, pain, scarring, weight regain, too little or too much weight loss, vitamin deficiencies and need for lifelong vitamin supplementation, hair loss, need for protein supplementation, leaks, stricture, reflux, food intolerance, gallstone formation, hernia, need for reoperation, need for open surgery, injury to spleen or surrounding structures, DVT's, PE, and death again discussed with the patient and  the patient expressed understanding and desires to proceed with minimally invasive sleeve gastrectomy, possible open, intraoperative endoscopy.  We discussed that before and after surgery that there would  be an alteration in their diet. I explained that we may put them on a diet 2 weeks before surgery. I also explained that they would be on a liquid diet for 2 weeks after surgery. We discussed that they would have to avoid certain foods after surgery. We discussed the importance of physical activity as well as compliance with our dietary and supplement recommendations and routine follow-up.

## 2023-01-17 NOTE — Progress Notes (Signed)
PHARMACY CONSULT FOR:  Risk Assessment for Post-Discharge VTE Following Bariatric Surgery  Post-Discharge VTE Risk Assessment: This patient's probability of 30-day post-discharge VTE is increased due to the factors marked: x Sleeve gastrectomy   Liver disorder (transplant, cirrhosis, or nonalcoholic steatohepatitis)   Hx of VTE   Hemorrhage requiring transfusion   GI perforation, leak, or obstruction   ====================================================    Female    Age >/=60 years   x BMI >/=50 kg/m2    CHF    Dyspnea at Rest    Paraplegia   x Non-gastric-band surgery    Operation Time >/=3 hr    Return to OR     Length of Stay >/= 3 d   Hypercoagulable condition   Significant venous stasis      Predicted probability of 30-day post-discharge VTE: 0.27% (Mild)  Recommendation for Discharge: No pharmacologic prophylaxis post-discharge   Gail Thomas is a 37 y.o. female who underwent laparoscopic sleeve gastrectomy (procedure) on 01/17/23    Case start:  759 Case end:  852   No Known Allergies  Patient Measurements: Height: '5\' 2"'$  (157.5 cm) Weight: 126.5 kg (278 lb 12.8 oz) IBW/kg (Calculated) : 50.1 Body mass index is 50.99 kg/m.  Recent Labs    01/17/23 0945  WBC 8.4  HGB 12.9  HCT 40.4  PLT 305  CREATININE 0.73   Estimated Creatinine Clearance: 125 mL/min (by C-G formula based on SCr of 0.73 mg/dL).    Past Medical History:  Diagnosis Date   Adiposity    Dermatofibrosarcoma protuberans of right lower extremity      Medications Prior to Admission  Medication Sig Dispense Refill Last Dose   levonorgestrel (MIRENA) 20 MCG/DAY IUD 1 each by Intrauterine route once.   01/17/2023   fluticasone (FLONASE) 50 MCG/ACT nasal spray Place 1 spray into both nostrils daily. (Patient not taking: Reported on 01/07/2023) 15.8 mL 0 Not Taking     Heywood Footman 01/17/2023,12:45 PM

## 2023-01-17 NOTE — Progress Notes (Signed)

## 2023-01-17 NOTE — Anesthesia Postprocedure Evaluation (Signed)
Anesthesia Post Note  Patient: Gail Thomas  Procedure(s) Performed: LAPAROSCOPIC SLEEVE GASTRECTOMY (Abdomen) UPPER GI ENDOSCOPY (Esophagus)     Patient location during evaluation: PACU Anesthesia Type: General Level of consciousness: awake and alert Pain management: pain level controlled Vital Signs Assessment: post-procedure vital signs reviewed and stable Respiratory status: spontaneous breathing, nonlabored ventilation, respiratory function stable and patient connected to nasal cannula oxygen Cardiovascular status: blood pressure returned to baseline and stable Postop Assessment: no apparent nausea or vomiting Anesthetic complications: no   No notable events documented.  Last Vitals:  Vitals:   01/17/23 1030 01/17/23 1048  BP: 135/88 (!) 155/95  Pulse: 61 (!) 54  Resp: 18 18  Temp:  36.6 C  SpO2: 100% 100%    Last Pain:  Vitals:   01/17/23 1109  TempSrc:   PainSc: Asleep                 Belenda Cruise P Sarath Privott

## 2023-01-17 NOTE — Op Note (Signed)
Preop Diagnosis: Obesity Class III  Postop Diagnosis: same  Procedure performed: laparoscopic Sleeve Gastrectomy  Assitant: Romana Juniper  Indications:  The patient is a 37 y.o. year-old morbidly obese female who has been followed in the Bariatric Clinic as an outpatient. This patient was diagnosed with morbid obesity with a BMI of Body mass index is 50.99 kg/m.   The patient was counseled extensively in the Bariatric Outpatient Clinic and after a thorough explanation of the risks and benefits of surgery (including death from complications, bowel leak, infection such as peritonitis and/or sepsis, internal hernia, bleeding, need for blood transfusion, bowel obstruction, organ failure, pulmonary embolus, deep venous thrombosis, wound infection, incisional hernia, skin breakdown, and others entailed on the consent form) and after a compliant diet and exercise program, the patient was scheduled for an elective laparoscopic sleeve gastrectomy.  Description of Operation:  Following informed consent, the patient was taken to the operating room and placed on the operating table in the supine position.  She had previously received prophylactic antibiotics and subcutaneous heparin for DVT prophylaxis in the pre-op holding area.  After induction of general endotracheal anesthesia by the anesthesiologist, the patient underwent placement of sequential compression devices and an oro-gastric tube.  A timeout was confirmed by the surgery and anesthesia teams.  The patient was adequately padded at all pressure points and placed on a footboard to prevent slippage from the OR table during extremes of position during surgery.  She underwent a routine sterile prep and drape of her entire abdomen.    Next, A transverse incision was made under the left subcostal area and a 26m optical viewing trocar was introduced into the peritoneal cavity. Pneumoperitoneum was applied with a high flow and low pressure. A laparoscope was  inserted to confirm placement. A extraperitoneal block was then placed at the lateral abdominal wall using exparel diluted with marcaine. 5 additional incisions were placed: 1 548mtrocar to the left of the midline. 1 additional 49m249mrocar in the left lateral area, 1 74m46mocar in the right mid abdomen, 1 49mm 80mcar in the right subcostal area, and a Nathanson retractor was placed through a subxiphoid incision.  The UGI showed a small hiatal hernia. Therefore, the pars flaccida was incised with harmonic scalpel. The stomach was reduced but on dissection of the posterior crus there was a visible hernia with small sac. The sac was dissected free and 1 0 ethibond sutures placed in interrupted fashion. A calibration tube was passed to ensure appropriate size of the hiatus.   Next, a hole was created through the lesser omentum along the greater curve of the stomach to enter the lesser sac. The vessels along the greater omentum were  Then ligated and divided using the Harmonic scalpel moving towards the spleen and then short gastric vessels were ligated and divided in the same fashion to fully mobilize the fundus. The left crus was identified to ensure completion of the dissection. Next the antrum was measured and dissection continued inferiorly along the greater curve towards the pylorus and stopped 6cm from the pylorus.   A 40Fr ViSiGi dilator was placed into the esophgaus and along the lesser curve of the stomach and placed on suction.  2 60mm 76m load echelon stapler(s) followed by 4 60mm b3mload echelon stapler(s) were used to make the resection along the antrum being sure to stay well away from the angularis by angling the jaws of the stapler towards the greater curve and later completing the resection staying along  the ViSiGi and ensuring the fundus was not retained by appropriately retracting it lateral. Air was inserted through the [place] to perform a leak test showing no bubbles and a neutral lie of  the stomach.  The assistant then went and performed an upper endoscopy and leak test.  No bubbles were seen and the sleeve and antrum distended appropriately. The specimen was then placed in an endocatch bag and removed by the 74m port. The pressure was changed to 8 mm Hg. Bleeding on the staple line was seen in 3 areas and clips were used for hemostasis. There was some hematoma formation along the mid stomach that appeared stable. Hemostasis was ensured. The fascia of the 134mport was closed with a 0 vicryl by suture passer. Pneumoperitoneum was evacuated, all ports were removed and all incisions closed with 4-0 monocryl suture in subcuticular fashion. Steristrips and bandaids were put in place for dressing. The patient awoke from anesthesia and was brought to pacu in stable condition. All counts were correct.  Estimated blood loss: <3018mSpecimens:  Sleeve gastrectomy  Local Anesthesia: 50 ml Exparel:0.5% Marcaine mix  Post-Op Plan:       Pain Management: PO, prn      Antibiotics: Prophylactic      Anticoagulation: Prophylactic, Starting now      Post Op Studies/Consults: Not applicable      Intended Discharge: within 48h      Intended Outpatient Follow-Up: Two Week      Intended Outpatient Studies: Not Applicable      Other: Not Applicable  Images:    LukArta Brucensinger

## 2023-01-17 NOTE — Op Note (Signed)
Preoperative diagnosis: laparoscopic sleeve gastrectomy  Postoperative diagnosis: Same   Procedure: Upper endoscopy   Surgeon: Shonique Pelphrey A Kerith Sherley, M.D.  Anesthesia: Gen.   Description of procedure: The endoscope was placed in the mouth and oropharynx and under endoscopic vision it was advanced to the esophagogastric junction which was identified at 38cm from the teeth.  The pouch was tensely insufflated while the upper abdomen was flooded with irrigation to perform a leak test, which was negative. No bubbles were seen.  The staple line was hemostatic and the lumen was evenly tubular without undue narrowing, angulation or twisting specifically at the incisura angularis. The lumen was decompressed and the scope was withdrawn without difficulty.    Nina Hoar A Ursula Dermody, M.D. General, Bariatric, & Minimally Invasive Surgery [facility]   

## 2023-01-18 ENCOUNTER — Encounter (HOSPITAL_COMMUNITY): Payer: Self-pay | Admitting: General Surgery

## 2023-01-18 LAB — CBC WITH DIFFERENTIAL/PLATELET
Abs Immature Granulocytes: 0.04 10*3/uL (ref 0.00–0.07)
Basophils Absolute: 0 10*3/uL (ref 0.0–0.1)
Basophils Relative: 0 %
Eosinophils Absolute: 0 10*3/uL (ref 0.0–0.5)
Eosinophils Relative: 0 %
HCT: 38.6 % (ref 36.0–46.0)
Hemoglobin: 12.7 g/dL (ref 12.0–15.0)
Immature Granulocytes: 1 %
Lymphocytes Relative: 13 %
Lymphs Abs: 1.1 10*3/uL (ref 0.7–4.0)
MCH: 29.5 pg (ref 26.0–34.0)
MCHC: 32.9 g/dL (ref 30.0–36.0)
MCV: 89.6 fL (ref 80.0–100.0)
Monocytes Absolute: 0.6 10*3/uL (ref 0.1–1.0)
Monocytes Relative: 7 %
Neutro Abs: 6.7 10*3/uL (ref 1.7–7.7)
Neutrophils Relative %: 79 %
Platelets: 311 10*3/uL (ref 150–400)
RBC: 4.31 MIL/uL (ref 3.87–5.11)
RDW: 12.7 % (ref 11.5–15.5)
WBC: 8.5 10*3/uL (ref 4.0–10.5)
nRBC: 0 % (ref 0.0–0.2)

## 2023-01-18 LAB — SURGICAL PATHOLOGY

## 2023-01-18 MED ORDER — ONDANSETRON 4 MG PO TBDP: 4.0000 mg | ORAL_TABLET | Freq: Four times a day (QID) | ORAL | 0 refills | Status: DC | PRN

## 2023-01-18 MED ORDER — PANTOPRAZOLE SODIUM 40 MG PO TBEC: 40.0000 mg | DELAYED_RELEASE_TABLET | Freq: Every day | ORAL | 0 refills | Status: DC

## 2023-01-18 MED ORDER — OXYCODONE HCL 5 MG/5ML PO SOLN: 5.0000 mg | Freq: Four times a day (QID) | ORAL | 0 refills | Status: DC | PRN

## 2023-01-18 MED ORDER — ACETAMINOPHEN 500 MG PO TABS: 1000.0000 mg | ORAL_TABLET | Freq: Three times a day (TID) | ORAL | 0 refills | Status: AC

## 2023-01-18 NOTE — Progress Notes (Signed)
Reviewed written D/C instructions with pt and all questions answered. Pt verbalized understanding. Pt d/c with belongings in stable condition.

## 2023-01-18 NOTE — Discharge Summary (Signed)
Physician Discharge Summary  Gail Thomas [id number] DOB: 04-14-1986 DOA: 01/17/2023  PCP: Pcp, No  Admit date: 01/17/2023 Discharge date:  01/18/2023   Recommendations for Outpatient Follow-up:   (include homehealth, outpatient follow-up instructions, specific recommendations for PCP to follow-up on, etc.)   Discharge Diagnoses:  Principal Problem:   Obesity   Surgical Procedure: laparoscopic sleeve gastrectomy, upper endoscopy  Discharge Condition: Good Disposition: Home  Diet recommendation: Postoperative sleeve gastrectomy diet (liquids only)  Filed Weights   01/17/23 0639 01/17/23 0640  Weight: 126.5 kg 126.5 kg     Hospital Course:  The patient was admitted after undergoing laparoscopic sleeve gastrectomy. POD 0 she ambulated well. POD 1 she was started on the water diet protocol and tolerated 160 ml in the first shift. Once meeting the water amount she was advanced to bariatric protein shakes which they tolerated and were discharged home POD 1.  Treatments: surgery: laparoscopic sleeve gastrectomy  Discharge Instructions  Discharge Instructions     Ambulate hourly while awake   Complete by: As directed    Call MD for:  difficulty breathing, headache or visual disturbances   Complete by: As directed    Call MD for:  persistant dizziness or light-headedness   Complete by: As directed    Call MD for:  persistant nausea and vomiting   Complete by: As directed    Call MD for:  redness, tenderness, or signs of infection (pain, swelling, redness, odor or green/yellow discharge around incision site)   Complete by: As directed    Call MD for:  severe uncontrolled pain   Complete by: As directed    Call MD for:  temperature >101 F   Complete by: As directed    Diet bariatric full liquid   Complete by: As directed    Discharge wound care:   Complete by: As directed    Remove Bandaids tomorrow, ok to shower tomorrow. Steristrips may fall off in 1-3 weeks.    Incentive spirometry   Complete by: As directed    Perform hourly while awake      Allergies as of 01/18/2023   No Known Allergies      Medication List     STOP taking these medications    fluticasone 50 MCG/ACT nasal spray Commonly known as: FLONASE       TAKE these medications    acetaminophen 500 MG tablet Commonly known as: TYLENOL Take 2 tablets (1,000 mg total) by mouth every 8 (eight) hours for 5 days.   levonorgestrel 20 MCG/DAY Iud Commonly known as: MIRENA 1 each by Intrauterine route once.   ondansetron 4 MG disintegrating tablet Commonly known as: ZOFRAN-ODT Take 1 tablet (4 mg total) by mouth every 6 (six) hours as needed for nausea or vomiting.   oxyCODONE 5 MG/5ML solution Commonly known as: ROXICODONE Take 5 mLs (5 mg total) by mouth every 6 (six) hours as needed for severe pain.   pantoprazole 40 MG tablet Commonly known as: PROTONIX Take 1 tablet (40 mg total) by mouth daily.               Discharge Care Instructions  (From admission, onward)           Start     Ordered   01/18/23 0000  Discharge wound care:       Comments: Remove Bandaids tomorrow, ok to shower tomorrow. Steristrips may fall off in 1-3 weeks.   01/18/23 [id number]  The results of significant diagnostics from this hospitalization (including imaging, microbiology, ancillary and laboratory) are listed below for reference.    Significant Diagnostic Studies: No results found.  Labs: Basic Metabolic Panel: Recent Labs  Lab 01/17/23 0945  CREATININE 0.73   Liver Function Tests: No results for input(s): "AST", "ALT", "ALKPHOS", "BILITOT", "PROT", "ALBUMIN" in the last 168 hours.  CBC: Recent Labs  Lab 01/17/23 0945 01/17/23 1257 01/18/23 0520  WBC 8.4  --  8.5  NEUTROABS  --   --  6.7  HGB 12.9 13.7 12.7  HCT 40.4 42.7 38.6  MCV 90.0  --  89.6  PLT 305  --  311    CBG: No results for input(s): "GLUCAP" in the last 168  hours.  Principal Problem:   Obesity   VTE plan: no chemical prophylaxis recommended ([email or web address])  Time coordinating discharge: 15 min

## 2023-01-18 NOTE — TOC CM/SW Note (Signed)
  Transition of Care (TOC) Screening Note   Patient Details  Name: Gail Thomas Date of Birth: 10-24-1986   Transition of Care [facility]) CM/SW Contact:    Lennart Pall, LCSW Phone Number: 01/18/2023, 11:21 AM    Transition of Care Department [facility]) has reviewed patient and no TOC needs have been identified at this time. We will continue to monitor patient advancement through interdisciplinary progression rounds. If new patient transition needs arise, please place a TOC consult.

## 2023-01-18 NOTE — Progress Notes (Signed)
Patient alert and oriented, pain is controlled. Patient is tolerating fluids, advanced to protein shake today, patient is tolerating well. Reviewed Gastric sleeve discharge instructions with patient and patient is able to articulate understanding. Provided information on BELT program, Support Group and [facility] outpatient pharmacy. Communicated general update of patient status to surgeon. All questions answered. 24hr fluid recall is 270 mL per hydration protocol, bariatric nurse coordinator to make follow-up phone call within one week.

## 2023-01-24 ENCOUNTER — Telehealth (HOSPITAL_COMMUNITY): Payer: Self-pay | Admitting: *Deleted

## 2023-01-24 NOTE — Telephone Encounter (Signed)
~  How are you doing with your protein Intake goals? (How much fluid have you consumed?)  Protein Intake consumed today is 60 grams of protein so far. Protein consumption is going well.  ~For fluid intake the pt stated that they consumed at least 64oz of fluid so far and the day prior  oz of fluid. She is not having any issues w/meeting her fluid goals. Reminded pt to keep track of how much fluid intake they are consuming throughout the next couple of weeks and to reach out if needed to [facility] for any signs/symptoms of dehydration or inability to meet fluid goals.  Pt reported that everything is going well and that she has not had any issues.    ~How do your incisions look?__ Pt reported steri strips have fallen off. They look fine Encouraged the pt to keep an eye out on their temperature and take note of any slight increase, take note of any changes in the drainage output, color, odor, amount, consistency and also to reach out to [facility] if any physical changes consistent with possible infection are noted.      ~How is your "Urine Output?  -  Urine is fine, voiding fine with no complications      ~What is your Pain Level?  No pain Were any meds used for pain control? "they were last week, but I have not needed to use any more"  ~Have you had a BM since d/c? Yes, had the first BM since Surgery on Saturday  Any laxatives taken? ___No Reminded pt continue aiming to meet fluid goal, ambulating, and to reach out to [facility] for any signs/symptoms of constipation that has not been resolved with the recommended miralax or milk of magnesia.   ~Has frequent Ambulation occurred, how so? Yes, I have a little one so there is no resting. Reminded pt to aim for 5-15 minutes of exercise a day such as walking  Incentive Spirometer Use? Yes, pt reported still using  Encouraged pt to continue using the incentive spirometer throughout the immediate recovery period and taking deep breaths in and out while walking to help  with supporting airway clearance. Reminded pt of signs/symptoms to look out for regarding the need to seek immediate healthcare attention.   Any Follow-up Intervention Needed?  Not at this time   Thank you,  Calton Dach, RN, MSN Bariatric Nurse Coordinator [phone] (office)

## 2023-02-01 ENCOUNTER — Encounter: Payer: Self-pay | Admitting: Dietician

## 2023-02-01 ENCOUNTER — Encounter: Payer: 59 | Attending: General Surgery | Admitting: Dietician

## 2023-02-01 VITALS — Ht 62.0 in | Wt 269.0 lb

## 2023-02-01 DIAGNOSIS — Z6841 Body Mass Index (BMI) 40.0 and over, adult: Secondary | ICD-10-CM | POA: Insufficient documentation

## 2023-02-01 DIAGNOSIS — E669 Obesity, unspecified: Secondary | ICD-10-CM

## 2023-02-01 DIAGNOSIS — Z713 Dietary counseling and surveillance: Secondary | ICD-10-CM | POA: Insufficient documentation

## 2023-02-01 NOTE — Progress Notes (Signed)
2 Week Post-Operative Nutrition Class   Patient was seen on 02/01/2023 for Post-Operative Nutrition education at the [facility].    Surgery date: 01/17/2023 Surgery type: Sleeve Gastrectomy  Anthropometrics  Start weight at [facility]: 274.4 lbs (date: 02/01/2022)  Height: 62 in Weight today: 269.0 lbs. 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 No Bowel Habits: Every day to every other day no complaints   Body Composition Scale 02/01/2023  Current Body Weight 269.0  Total Body Fat % 47.4  Visceral Fat 16  Fat-Free Mass % 52.5   Total Body Water % 40.7  Muscle-Mass lbs 31.4  BMI 49.1  Body Fat Displacement          Torso  lbs 79.1         Left Leg  lbs 15.8         Right Leg  lbs 15.8         Left Arm  lbs 7.9         Right Arm  lbs 7.9   The following the learning objectives were met by the patient during this course: Identifies Soft Prepped Plan Advancement Guide  Identifies Soft, High Proteins (Phase 1), beginning 2 weeks post-operatively to 3 weeks post-operatively Identifies Additional Soft High Proteins, soft non-starchy vegetables, fruits and starches (Phase 2), beginning 3 weeks post-operatively to 3 months post-operatively Identifies appropriate sources of fluids, proteins, vegetables, fruits and starches Identifies appropriate fat sources and healthy verses unhealthy fat types   States protein, vegetable, fruit and starch recommendations and appropriate sources post-operatively Identifies the need for appropriate texture modifications, mastication, and bite sizes when consuming solids Identifies appropriate fat consumption and sources Identifies appropriate multivitamin and calcium sources post-operatively Describes the need for physical activity post-operatively and will follow MD  recommendations States when to call healthcare provider regarding medication questions or post-operative complications   Handouts given during class include: Soft Prepped Plan Advancement Guide   Follow-Up Plan: Patient will follow-up at [facility] in 10 weeks for 3 month post-op nutrition visit for diet advancement per MD.

## 2023-02-10 ENCOUNTER — Telehealth: Payer: Self-pay | Admitting: Dietician

## 2023-02-10 NOTE — Telephone Encounter (Signed)
RD called pt to verify fluid intake once starting soft, solid proteins 2 week post-bariatric surgery.   Daily Fluid intake: Daily Protein intake: Bowel Habits:   Concerns/issues:    Left Voice Message 

## 2023-04-12 ENCOUNTER — Ambulatory Visit: Payer: 59 | Admitting: Dietician

## 2023-05-04 ENCOUNTER — Ambulatory Visit: Payer: 59 | Admitting: Skilled Nursing Facility1

## 2023-08-10 ENCOUNTER — Ambulatory Visit (HOSPITAL_BASED_OUTPATIENT_CLINIC_OR_DEPARTMENT_OTHER): Payer: 59 | Admitting: Family Medicine

## 2023-08-10 ENCOUNTER — Encounter (HOSPITAL_BASED_OUTPATIENT_CLINIC_OR_DEPARTMENT_OTHER): Payer: Self-pay

## 2023-08-24 ENCOUNTER — Ambulatory Visit (HOSPITAL_BASED_OUTPATIENT_CLINIC_OR_DEPARTMENT_OTHER): Payer: 59

## 2023-08-24 ENCOUNTER — Ambulatory Visit (INDEPENDENT_AMBULATORY_CARE_PROVIDER_SITE_OTHER): Payer: 59 | Admitting: Family Medicine

## 2023-08-24 VITALS — BP 108/65 | HR 75 | Ht 62.0 in | Wt 241.2 lb

## 2023-08-24 DIAGNOSIS — G8929 Other chronic pain: Secondary | ICD-10-CM | POA: Diagnosis not present

## 2023-08-24 DIAGNOSIS — E66813 Obesity, class 3: Secondary | ICD-10-CM

## 2023-08-24 DIAGNOSIS — Z7689 Persons encountering health services in other specified circumstances: Secondary | ICD-10-CM

## 2023-08-24 DIAGNOSIS — M25561 Pain in right knee: Secondary | ICD-10-CM

## 2023-08-24 DIAGNOSIS — Z6841 Body Mass Index (BMI) 40.0 and over, adult: Secondary | ICD-10-CM

## 2023-08-24 LAB — LIPID PANEL
Chol/HDL Ratio: 3.8 {ratio} (ref 0.0–4.4)
Cholesterol, Total: 196 mg/dL (ref 100–199)
HDL: 51 mg/dL (ref >39)
LDL Chol Calc (NIH): 128 mg/dL — ABNORMAL HIGH (ref 0–99)
Triglycerides: 96 mg/dL (ref 0–149)
VLDL Cholesterol Cal: 17 mg/dL (ref 5–40)

## 2023-08-24 LAB — CBC WITH DIFFERENTIAL/PLATELET
Basophils Absolute: 0 10*3/uL (ref 0.0–0.2)
Basos: 1 %
EOS (ABSOLUTE): 0.1 10*3/uL (ref 0.0–0.4)
Eos: 2 %
Hematocrit: 41.3 % (ref 34.0–46.6)
Hemoglobin: 13.6 g/dL (ref 11.1–15.9)
Immature Grans (Abs): 0 10*3/uL (ref 0.0–0.1)
Immature Granulocytes: 0 %
Lymphocytes Absolute: 2.5 10*3/uL (ref 0.7–3.1)
Lymphs: 41 %
MCH: 30.1 pg (ref 26.6–33.0)
MCHC: 32.9 g/dL (ref 31.5–35.7)
MCV: 91 fL (ref 79–97)
Monocytes Absolute: 0.5 10*3/uL (ref 0.1–0.9)
Monocytes: 9 %
Neutrophils Absolute: 2.8 10*3/uL (ref 1.4–7.0)
Neutrophils: 47 %
Platelets: 357 10*3/uL (ref 150–450)
RBC: 4.52 x10E6/uL (ref 3.77–5.28)
RDW: 12.4 % (ref 11.7–15.4)
WBC: 6 10*3/uL (ref 3.4–10.8)

## 2023-08-24 LAB — COMPREHENSIVE METABOLIC PANEL
ALT: 15 [IU]/L (ref 0–32)
AST: 14 [IU]/L (ref 0–40)
Albumin: 4 g/dL (ref 3.9–4.9)
Alkaline Phosphatase: 112 [IU]/L (ref 44–121)
BUN/Creatinine Ratio: 11 (ref 9–23)
BUN: 9 mg/dL (ref 6–20)
Bilirubin Total: 0.3 mg/dL (ref 0.0–1.2)
CO2: 25 mmol/L (ref 20–29)
Calcium: 9.5 mg/dL (ref 8.7–10.2)
Chloride: 105 mmol/L (ref 96–106)
Creatinine, Ser: 0.8 mg/dL (ref 0.57–1.00)
Globulin, Total: 2.3 g/dL (ref 1.5–4.5)
Glucose: 79 mg/dL (ref 70–99)
Potassium: 4.4 mmol/L (ref 3.5–5.2)
Sodium: 139 mmol/L (ref 134–144)
Total Protein: 6.3 g/dL (ref 6.0–8.5)
eGFR: 98 mL/min/{1.73_m2} (ref >59)

## 2023-08-24 LAB — HEMOGLOBIN A1C
Est. average glucose Bld gHb Est-mCnc: 120 mg/dL
Hgb A1c MFr Bld: 5.8 % — ABNORMAL HIGH (ref 4.8–5.6)

## 2023-08-24 LAB — TSH RFX ON ABNORMAL TO FREE T4: TSH: 1.84 u[IU]/mL (ref 0.450–4.500)

## 2023-08-24 NOTE — Progress Notes (Signed)
New Patient Office Visit  Subjective    Patient ID: Gail Thomas, female    DOB: 1986-09-20  Age: 37 y.o. MRN: [id number]  CC:  Chief Complaint  Patient presents with   New Patient (Initial Visit)    Patient is here to get established with the practice. States she recently had gastric surgery and has been having issues with her knee that she wants to discuss.    HPI Gail Thomas is a 37 year old female who presents to establish with Primary Care & Sports Medicine at [facility]  Former PCP: none   Surgical hx: laprascopic sleeve gastrectomy in March 2024 Since surgery, she has noticed a lot of weight loss. Since this weight loss, she has noticed pain in her R knee (more noticeable over the past two months). Reports that her R knee looks larger than her L and feels "tight."  She works 12 hour shifts, and often notices R knee pain that is more significant after work.   Outpatient Encounter Medications as of 08/24/2023  Medication Sig   Calcium-Vitamin D-Vitamin K (CHEWABLE CALCIUM PO) Take by mouth.   levonorgestrel (MIRENA) 20 MCG/DAY IUD 1 each by Intrauterine route once.   [DISCONTINUED] levonorgestrel (MIRENA) 20 MCG/DAY IUD 1 each by Intrauterine route once.   [DISCONTINUED] ondansetron (ZOFRAN-ODT) 4 MG disintegrating tablet Take 1 tablet (4 mg total) by mouth every 6 (six) hours as needed for nausea or vomiting.   [DISCONTINUED] oxyCODONE (ROXICODONE) 5 MG/5ML solution Take 5 mLs (5 mg total) by mouth every 6 (six) hours as needed for severe pain.   [DISCONTINUED] pantoprazole (PROTONIX) 40 MG tablet Take 1 tablet (40 mg total) by mouth daily.   No facility-administered encounter medications on file as of 08/24/2023.    Past Medical History:  Diagnosis Date   Adiposity    Dermatofibrosarcoma protuberans of right lower extremity     Past Surgical History:  Procedure Laterality Date   CESAREAN SECTION     x3   LAPAROSCOPIC GASTRIC SLEEVE  RESECTION N/A 01/17/2023   Procedure: LAPAROSCOPIC SLEEVE GASTRECTOMY;  Surgeon: Rodman Pickle, MD;  Location: [facility];  Service: General;  Laterality: N/A;   LESION REMOVAL Right 01/07/2016   Procedure: RE EXCISION OF RIGHT LEG DERMATOFIBROSARCOMA ;  Surgeon: Peggye Form, DO;  Location: [facility];  Service: Plastics;  Laterality: Right;   UPPER GI ENDOSCOPY N/A 01/17/2023   Procedure: UPPER GI ENDOSCOPY;  Surgeon: Sheliah Hatch, De Blanch, MD;  Location: [facility];  Service: General;  Laterality: N/A;    Family History  Problem Relation Age of Onset   Diabetes Maternal Grandmother    Cancer - Cervical Maternal Grandmother    Review of Systems  Constitutional:  Negative for malaise/fatigue.  Eyes:  Negative for blurred vision and double vision.  Respiratory:  Negative for cough and shortness of breath.   Cardiovascular:  Negative for chest pain, palpitations, orthopnea, claudication and leg swelling.  Gastrointestinal:  Negative for abdominal pain, nausea and vomiting.  Musculoskeletal:  Positive for joint pain (R knee). Negative for myalgias.  Neurological:  Negative for dizziness, weakness and headaches.  Psychiatric/Behavioral:  Negative for depression and suicidal ideas. The patient is not nervous/anxious.         Objective    BP 108/65 (BP Location: Right Arm, Patient Position: Sitting, Cuff Size: Large)   Pulse 75   Ht 5\' 2"  (1.575 m)   Wt 241 lb 3.2 oz (109.4 kg)   SpO2 99%  BMI 44.12 kg/m   Physical Exam Constitutional:      Appearance: Normal appearance.  Cardiovascular:     Rate and Rhythm: Normal rate and regular rhythm.     Pulses: Normal pulses.     Heart sounds: Normal heart sounds.  Pulmonary:     Effort: Pulmonary effort is normal.     Breath sounds: Normal breath sounds.  Musculoskeletal:     Right knee: Swelling present. No deformity, erythema, ecchymosis or crepitus. Normal range of motion. No tenderness.     Instability  Tests: Anterior drawer test negative. Posterior drawer test negative.     Left knee: Normal.     Right lower leg: Normal.     Left lower leg: Normal.  Neurological:     Mental Status: She is alert.  Psychiatric:        Mood and Affect: Mood normal.        Behavior: Behavior normal.       Assessment & Plan:   1. Encounter to establish care Patient is a 46- year-old female who presents today to establish care with primary care at [facility]. Reviewed the past medical history, family history, social history, surgical history, medications and allergies today- updates made as indicated. Will obtain non-fasting labs today for future routine physical. She has concerns today about pain in her right knee.  - CBC with Differential/Platelet - Comprehensive metabolic panel - Hemoglobin A1c - Lipid panel - TSH Rfx on Abnormal to Free T4  2. Class 3 severe obesity due to excess calories without serious comorbidity with body mass index (BMI) of 40.0 to 44.9 in adult [facility]) Patient underwent laparoscopic sleeve gastrectomy in March 2024. Followed by [facility]. Reports she is tolerating food well and is taking multivitamin supplement.   3. Chronic pain of right knee Patient presents today with right knee pain x2 months. Right knee appears larger than the left knee, but no erythema, redness, or ecchymosis present. Swelling present along inferomedial & inferolateral aspects of patella. No bursitis palpated. Patient denies tenderness to palpation. Denies recent trauma/injury, limping, locking sensation, weakness, or gait abnormalities. No laxity, excessive patella motility, clicking/locking, or crepitus present on physical exam. Normal ROM of both knees. Will obtain x-ray. May require further imaging, ultrasound or MRI, to determine if effusion is present.   - DG Knee Complete 4 Views Right; Future   Return in about 6 weeks (around 10/05/2023) for Physical.   Alyson Reedy,  FNP

## 2023-08-26 ENCOUNTER — Other Ambulatory Visit (HOSPITAL_BASED_OUTPATIENT_CLINIC_OR_DEPARTMENT_OTHER): Payer: Self-pay | Admitting: Family Medicine

## 2023-08-26 DIAGNOSIS — M25561 Pain in right knee: Secondary | ICD-10-CM

## 2023-09-01 ENCOUNTER — Telehealth: Payer: 59 | Admitting: Physician Assistant

## 2023-09-01 DIAGNOSIS — B3731 Acute candidiasis of vulva and vagina: Secondary | ICD-10-CM | POA: Diagnosis not present

## 2023-09-02 MED ORDER — FLUCONAZOLE 150 MG PO TABS: 150.0000 mg | ORAL_TABLET | ORAL | 0 refills | Status: DC | PRN

## 2023-09-02 NOTE — Progress Notes (Signed)

## 2023-09-07 ENCOUNTER — Encounter (HOSPITAL_BASED_OUTPATIENT_CLINIC_OR_DEPARTMENT_OTHER): Payer: Self-pay | Admitting: Family Medicine

## 2023-09-18 ENCOUNTER — Other Ambulatory Visit: Payer: 59

## 2023-09-23 ENCOUNTER — Ambulatory Visit
Admission: RE | Admit: 2023-09-23 | Discharge: 2023-09-23 | Disposition: A | Discharge status: Home / Self Care | Payer: 59 | Source: Ambulatory Visit | Attending: Family Medicine

## 2023-09-23 DIAGNOSIS — M25561 Pain in right knee: Secondary | ICD-10-CM

## 2023-09-24 ENCOUNTER — Telehealth: Payer: 59 | Admitting: Nurse Practitioner

## 2023-09-24 DIAGNOSIS — B3731 Acute candidiasis of vulva and vagina: Secondary | ICD-10-CM | POA: Diagnosis not present

## 2023-09-24 MED ORDER — FLUCONAZOLE 150 MG PO TABS: 150.0000 mg | ORAL_TABLET | ORAL | 0 refills | Status: DC | PRN

## 2023-09-24 NOTE — Progress Notes (Signed)

## 2023-09-24 NOTE — Progress Notes (Signed)
I have spent 5 minutes in review of e-visit questionnaire, review and updating patient chart, medical decision making and response to patient.  ° °Jerrell Mangel W Secilia Apps, NP ° °  °

## 2023-09-29 ENCOUNTER — Encounter (HOSPITAL_BASED_OUTPATIENT_CLINIC_OR_DEPARTMENT_OTHER): Payer: Self-pay | Admitting: Family Medicine

## 2023-10-05 ENCOUNTER — Encounter (HOSPITAL_BASED_OUTPATIENT_CLINIC_OR_DEPARTMENT_OTHER): Payer: 59 | Admitting: Family Medicine

## 2023-10-11 ENCOUNTER — Encounter (HOSPITAL_BASED_OUTPATIENT_CLINIC_OR_DEPARTMENT_OTHER): Payer: Self-pay | Admitting: *Deleted

## 2023-10-11 ENCOUNTER — Other Ambulatory Visit: Payer: Self-pay

## 2023-10-11 ENCOUNTER — Emergency Department (HOSPITAL_BASED_OUTPATIENT_CLINIC_OR_DEPARTMENT_OTHER)
Admission: EM | Admit: 2023-10-11 | Discharge: 2023-10-11 | Disposition: A | Discharge status: Home / Self Care | Payer: 59 | Attending: Emergency Medicine | Admitting: Emergency Medicine

## 2023-10-11 DIAGNOSIS — H02844 Edema of left upper eyelid: Secondary | ICD-10-CM | POA: Diagnosis present

## 2023-10-11 DIAGNOSIS — L03213 Periorbital cellulitis: Secondary | ICD-10-CM | POA: Insufficient documentation

## 2023-10-11 DIAGNOSIS — H02841 Edema of right upper eyelid: Secondary | ICD-10-CM | POA: Insufficient documentation

## 2023-10-11 MED ORDER — AMOXICILLIN-POT CLAVULANATE 875-125 MG PO TABS: 1.0000 | ORAL_TABLET | Freq: Two times a day (BID) | ORAL | 0 refills | Status: AC

## 2023-10-11 MED ORDER — SULFAMETHOXAZOLE-TRIMETHOPRIM 800-160 MG PO TABS: 1.0000 | ORAL_TABLET | Freq: Two times a day (BID) | ORAL | 0 refills | Status: AC

## 2023-10-11 NOTE — ED Notes (Addendum)
Discharge paperwork given and verbally understood.... No care given besides the discharge paperwork.Marland Kitchen

## 2023-10-11 NOTE — ED Provider Notes (Signed)
[facility] AT [facility] Provider Note   CSN: [id number] Arrival date & time: 10/11/23  9562     History  Chief Complaint  Patient presents with   Eye Problem    Gail Thomas is a 37 y.o. female who presents with concern for bilateral upper eyelid swelling.  States she had false eyelashes placed 5 weeks ago and had some irritation to the upper eyelids and crusting that occurred immediately after.  This resolved after couple days of warm compresses.  She reports scratching her eyelid a couple days ago, and the swelling of her eyelids worsen.  She denies any changes in vision.  Denies pain with eye movement.  Denies any eye discharge.   Eye Problem      Home Medications Prior to Admission medications   Medication Sig Start Date End Date Taking? Authorizing Provider  amoxicillin-clavulanate (AUGMENTIN) 875-125 MG tablet Take 1 tablet by mouth every 12 (twelve) hours for 10 days. 10/11/23 10/21/23 Yes Arabella Merles, PA-C  sulfamethoxazole-trimethoprim (BACTRIM DS) 800-160 MG tablet Take 1 tablet by mouth 2 (two) times daily for 10 days. 10/11/23 10/21/23 Yes Arabella Merles, PA-C  Calcium-Vitamin D-Vitamin K (CHEWABLE CALCIUM PO) Take by mouth.    [provider]  fluconazole (DIFLUCAN) 150 MG tablet Take 1 tablet (150 mg total) by mouth every 3 (three) days as needed. 09/24/23   Claiborne Rigg, NP  levonorgestrel (MIRENA) 20 MCG/DAY IUD 1 each by Intrauterine route once.    [provider]      Allergies    Patient has no known allergies.    Review of Systems   Review of Systems  HENT:         No changes in vision    Physical Exam Updated Vital Signs BP 113/76 (BP Location: Right Arm)   Pulse 82   Temp 97.8 F (36.6 C)   Resp 18   SpO2 100%  Physical Exam Vitals and nursing note reviewed.  Constitutional:      Appearance: Normal appearance.  HENT:     Head: Atraumatic.  Eyes:     Extraocular Movements:  Extraocular movements intact.     Pupils: Pupils are equal, round, and reactive to light.     Comments: Edematous and erythematous upper eyelids bilaterally, right greater than left  No eye discharge  EOM intact, performed without pain No proptosis   Cardiovascular:     Rate and Rhythm: Normal rate and regular rhythm.  Pulmonary:     Effort: Pulmonary effort is normal.  Neurological:     General: No focal deficit present.     Mental Status: She is alert.  Psychiatric:        Mood and Affect: Mood normal.        Behavior: Behavior normal.     ED Results / Procedures / Treatments   Labs (all labs ordered are listed, but only abnormal results are displayed) Labs Reviewed - No data to display  EKG None  Radiology No results found.  Procedures Procedures    Medications Ordered in ED Medications - No data to display  ED Course/ Medical Decision Making/ A&P                                 Medical Decision Making    Differential diagnosis includes but is not limited to conjunctivitis, corneal abrasion, eye trauma, preseptal cellulitis, orbital cellulitis, chalazion, hordeolum  ED Course:  Patient with edematous and erythematous upper eyelids bilaterally.  The left more so than the right.  This does not completely occlude vision.  She denies any visual changes.  Denies pain with eye movement, no proptosis.  Suspect preseptal cellulitis, low concern for orbital cellulitis at this time.  She denies any pain of the eye itself, low concern for any corneal abrasion denies any trauma to the eyes. Will treat with 10-day course of Bactrim and Augmentin.  Instructed her to follow-up with PCP in the next 3 to 4 days if she has not noticed any improvement.  Strict return precautions given including any changes in vision, pain, fever, any other new or concerning symptoms.  Appropriate for discharge home this time.   Impression: Bilateral preseptal  cellulitis                Final Clinical Impression(s) / ED Diagnoses Final diagnoses:  Preseptal cellulitis of right upper eyelid  Preseptal cellulitis of left upper eyelid    Rx / DC Orders ED Discharge Orders          Ordered    sulfamethoxazole-trimethoprim (BACTRIM DS) 800-160 MG tablet  2 times daily        10/11/23 1013    amoxicillin-clavulanate (AUGMENTIN) 875-125 MG tablet  Every 12 hours        10/11/23 1013              Arabella Merles, PA-C 10/11/23 1042    Jacalyn Lefevre, MD 10/11/23 1212

## 2023-10-11 NOTE — Discharge Instructions (Addendum)
You have an infection of your eyelids. Please keep your eyelids clean with soap and water. Do not use any makeup on them to prevent irritation.   Use warm compresses on your eyes 10 minutes at a time 3-4 times a day.  You have been prescribed bactrim (Trimethoprim-sulfamethoxazole). Take this antibiotic 2 times a day for the next 10 days.   You have been prescribed Augmentin (Amoxicillin-clavulanate). Take this antibiotic 2 times a day for the next 10 days.   You may use up to 800mg  ibuprofen every 8 hours as needed for pain.  Do not exceed 2.4g of ibuprofen per day.   Take the full course of your antibiotics even if you start feeling better. Antibiotics may cause you to have diarrhea.  Please follow-up with your PCP if you do not start to notice improvement within the next 3 to 4 days.  Return the ER if you have pain with eye movement, changes in vision, any other new or concerning symptoms.

## 2023-10-11 NOTE — ED Triage Notes (Signed)
Eyelid swelling and redness and itching.  Pt has had this since having eyelid extension.  Not affecting her vision

## 2023-10-12 ENCOUNTER — Ambulatory Visit (INDEPENDENT_AMBULATORY_CARE_PROVIDER_SITE_OTHER): Payer: 59 | Admitting: Family Medicine

## 2023-10-12 ENCOUNTER — Encounter (HOSPITAL_BASED_OUTPATIENT_CLINIC_OR_DEPARTMENT_OTHER): Payer: Self-pay | Admitting: Family Medicine

## 2023-10-12 VITALS — BP 115/67 | HR 71 | Ht 62.0 in | Wt 230.8 lb

## 2023-10-12 DIAGNOSIS — Z0000 Encounter for general adult medical examination without abnormal findings: Secondary | ICD-10-CM

## 2023-10-12 DIAGNOSIS — G8929 Other chronic pain: Secondary | ICD-10-CM

## 2023-10-12 DIAGNOSIS — M25561 Pain in right knee: Secondary | ICD-10-CM | POA: Diagnosis not present

## 2023-10-12 NOTE — Progress Notes (Signed)
Complete physical exam  Patient: Gail Thomas   DOB: [birth year]   37 y.o. Female  MRN: [id number]  Subjective:    Gail Thomas is a 37 y.o. female who presents today for a complete physical exam. She reports consuming a general diet, still incorporates weight loss meals. Gym/ health club routine includes mod to heavy weightlifting and walking on track . She works out twice weekly at [employer] She generally feels well. She reports sleeping well. She does not have additional problems to discuss today.   No longer having R knee pain- discussed MRI  HEALTH SCREENINGS: - Vision Screening: up to date (Sept 2024) - Dental Visits: up to date (Sept 2024) - Pap smear: scheduled with OBGYN - Breast Exam: scheduled with OBGYN - STD Screening: Declined - Mammogram (40+): Not applicable  - Colonoscopy (45+): Not applicable  - Bone Density (65+ or under 65 with predisposing conditions): Not applicable  - Lung CA screening with low-dose CT:  Not applicable Adults age 49-80 who are current cigarette smokers or quit within the last 15 years. Must have 20 pack year history.   Depression screenings:    08/24/2023   10:04 AM 11/19/2022   11:20 AM 03/12/2022    9:54 PM  Depression screen PHQ 2/9  Decreased Interest 0    Down, Depressed, Hopeless 0    PHQ - 2 Score 0    Altered sleeping 0    Tired, decreased energy 0    Change in appetite 0    Feeling bad or failure about yourself  0    Trouble concentrating 0    Moving slowly or fidgety/restless 0    Suicidal thoughts 0    PHQ-9 Score 0    Difficult doing work/chores Not difficult at all       Information is confidential and restricted. Go to Review Flowsheets to unlock data.   Anxiety screenings:    08/24/2023   10:04 AM  GAD 7 : Generalized Anxiety Score  Nervous, Anxious, on Edge 0  Control/stop worrying 0  Worry too much - different things 0  Trouble relaxing 0  Restless 0  Easily annoyed or irritable 0  Afraid - awful might  happen 0  Total GAD 7 Score 0  Anxiety Difficulty Not difficult at all   Patient Care Team: Alyson Reedy, FNP as PCP - General (Family Medicine) Almon Register, DO as Referring Physician (Obstetrics and Gynecology)   Outpatient Medications Prior to Visit  Medication Sig   amoxicillin-clavulanate (AUGMENTIN) 875-125 MG tablet Take 1 tablet by mouth every 12 (twelve) hours for 10 days.   Calcium-Vitamin D-Vitamin K (CHEWABLE CALCIUM PO) Take by mouth.   fluconazole (DIFLUCAN) 150 MG tablet Take 1 tablet (150 mg total) by mouth every 3 (three) days as needed.   levonorgestrel (MIRENA) 20 MCG/DAY IUD 1 each by Intrauterine route once.   sulfamethoxazole-trimethoprim (BACTRIM DS) 800-160 MG tablet Take 1 tablet by mouth 2 (two) times daily for 10 days.   No facility-administered medications prior to visit.    Review of Systems  Constitutional:  Negative for weight loss.  HENT:  Negative for ear pain and tinnitus.   Eyes:  Negative for blurred vision and double vision.  Respiratory:  Negative for cough and shortness of breath.   Cardiovascular:  Negative for chest pain, palpitations, leg swelling and PND.  Gastrointestinal:  Negative for abdominal pain, nausea and vomiting.  Genitourinary:  Negative for frequency, hematuria and urgency.  Musculoskeletal:  Negative for myalgias.  Skin:  Positive for itching (eyelids).  Neurological:  Negative for dizziness, weakness and headaches.  Psychiatric/Behavioral:  Negative for depression and suicidal ideas. The patient is not nervous/anxious and does not have insomnia.        Objective:     BP 115/67   Pulse 71   Ht 5\' 2"  (1.575 m)   Wt 230 lb 12.8 oz (104.7 kg)   BMI 42.21 kg/m  BP Readings from Last 3 Encounters:  10/12/23 115/67  10/11/23 (!) 109/59  08/24/23 108/65     Physical Exam Vitals reviewed.  Constitutional:      Appearance: Normal appearance.  HENT:     Head: Normocephalic.     Right Ear: Tympanic  membrane, ear canal and external ear normal.     Left Ear: Tympanic membrane, ear canal and external ear normal.     Nose: Nose normal.     Mouth/Throat:     Mouth: Mucous membranes are moist.     Pharynx: Oropharynx is clear.  Eyes:     General: Vision grossly intact.     Extraocular Movements: Extraocular movements intact.     Pupils: Pupils are equal, round, and reactive to light.  Cardiovascular:     Rate and Rhythm: Normal rate and regular rhythm.     Pulses: Normal pulses.     Heart sounds: Normal heart sounds.  Pulmonary:     Effort: Pulmonary effort is normal.     Breath sounds: Normal breath sounds.  Abdominal:     General: Abdomen is flat. Bowel sounds are normal.     Palpations: Abdomen is soft.  Musculoskeletal:        General: Normal range of motion.     Cervical back: Normal range of motion.  Skin:    General: Skin is warm and dry.     Findings: Erythema and rash (itching of bilateral eyelids) present. Rash is urticarial.  Neurological:     Mental Status: She is alert.  Psychiatric:        Mood and Affect: Mood normal.        Behavior: Behavior normal.        Thought Content: Thought content normal.        Judgment: Judgment normal.       Assessment & Plan:    Routine Health Maintenance and Physical Exam  Health Maintenance  Topic Date Due   Pap with HPV screening  09/15/2020   COVID-19 Vaccine (1) 10/28/2023*   Flu Shot  02/06/2024*   Hepatitis C Screening  10/11/2024*   HIV Screening  10/11/2024*   DTaP/Tdap/Td vaccine (2 - Td or Tdap) 05/03/2028   HPV Vaccine  Aged Out  *Topic was postponed. The date shown is not the original due date.   1. Wellness examination Routine labs reviewed and discussed today.   Review of PMH, FH, SH, medications and HM performed. Preventative care hand-out provided.  Recommend healthy diet.  Recommend approximately 150 minutes/week of moderate intensity exercise. Recommend regular dental and vision exams. Always use  seatbelt/lap and shoulder restraints. Recommend using smoke alarms and checking batteries at least twice a year. Recommend using sunscreen when outside. Discussed immunization recommendations. Vaccines are UTD.  She has upcoming appt with OBGYN for cervical cancer screening and breast exam.   2. Chronic pain of right knee Discussed recent MRI results. Patient reports she is no longer experiencing knee pain or swelling. Counseled patient to advise office if she is still having  knee pain and we can place referral for orthopedics.    Return in about 1 year (around 10/11/2024) for Physical with fasting labs.     Alyson Reedy, FNP

## 2023-11-07 ENCOUNTER — Telehealth: Payer: Self-pay

## 2023-11-07 NOTE — Telephone Encounter (Signed)
Copied from CRM [id number]. Topic: Appointments - Transfer of Care >> Nov 07, 2023  1:17 PM Gurney Maxin H wrote: Pt is requesting to transfer FROM: [address] Pt is requesting to transfer TO: [facility] Reason for requested transfer: Patient states she wants to switch providers It is the responsibility of the team the patient would like to transfer to (Dr. Seabron Spates) to reach out to the patient if for any reason this transfer is not acceptable.

## 2024-01-13 ENCOUNTER — Ambulatory Visit: Payer: 59 | Admitting: Plastic Surgery

## 2024-01-13 ENCOUNTER — Encounter: Payer: Self-pay | Admitting: Plastic Surgery

## 2024-01-13 VITALS — BP 121/79 | HR 88 | Ht 62.0 in

## 2024-01-13 DIAGNOSIS — F1210 Cannabis abuse, uncomplicated: Secondary | ICD-10-CM | POA: Diagnosis not present

## 2024-01-13 DIAGNOSIS — M549 Dorsalgia, unspecified: Secondary | ICD-10-CM | POA: Insufficient documentation

## 2024-01-13 DIAGNOSIS — M793 Panniculitis, unspecified: Secondary | ICD-10-CM

## 2024-01-13 DIAGNOSIS — M542 Cervicalgia: Secondary | ICD-10-CM | POA: Diagnosis not present

## 2024-01-13 DIAGNOSIS — M546 Pain in thoracic spine: Secondary | ICD-10-CM | POA: Diagnosis not present

## 2024-01-13 DIAGNOSIS — Z9884 Bariatric surgery status: Secondary | ICD-10-CM

## 2024-01-13 DIAGNOSIS — G8929 Other chronic pain: Secondary | ICD-10-CM

## 2024-01-13 NOTE — Progress Notes (Signed)
 Patient ID: Gail Thomas, female    DOB: 13-Mar-1986, 38 y.o.   MRN: [id number]   Chief Complaint  Patient presents with   Advice Only   Skin Problem    The patient is a 38 year old female here for evaluation of her abdomen.  She is 5 feet 2 inches tall and weighs 234 pounds.  She lost 65 pounds over the past year.  She had a gastric sleeve in March 2024.  She is also had a hernia repair and 3 C-sections.  She has healed those without difficulty.  Her C-section scar was pretty well-healed just is a little bit separated but no hypertrophic scarring noted.  She has good muscle strength in her abdomen.  She she is wanting to lose maybe 30 more pounds and then she will likely be at her desired weight.  She does complain of neck and back pain that has not improved with her weight loss.  The pannus has gotten quite a bit larger and she believes this is causing the back pain.  She complains of skin breakdown and odor in her skin folds.  It was quite strong today.  She is not a smoker and does not have diabetes.  She does admit to cannabis use and that would have to be stopped prior to surgery.    Review of Systems  Constitutional:  Positive for activity change and fever. Negative for appetite change.  HENT: Negative.    Eyes: Negative.   Respiratory: Negative.  Negative for chest tightness.   Cardiovascular: Negative.   Gastrointestinal: Negative.   Endocrine: Negative.   Genitourinary: Negative.   Musculoskeletal:  Positive for back pain and neck pain.  Skin:  Positive for rash.    Past Medical History:  Diagnosis Date   Adiposity    Dermatofibrosarcoma protuberans of right lower extremity     Past Surgical History:  Procedure Laterality Date   CESAREAN SECTION     x3   LAPAROSCOPIC GASTRIC SLEEVE RESECTION N/A 01/17/2023   Procedure: LAPAROSCOPIC SLEEVE GASTRECTOMY;  Surgeon: Rodman Pickle, MD;  Location: [facility];  Service: General;  Laterality: N/A;   LESION REMOVAL Right  01/07/2016   Procedure: RE EXCISION OF RIGHT LEG DERMATOFIBROSARCOMA ;  Surgeon: Peggye Form, DO;  Location: [facility];  Service: Plastics;  Laterality: Right;   UPPER GI ENDOSCOPY N/A 01/17/2023   Procedure: UPPER GI ENDOSCOPY;  Surgeon: Sheliah Hatch, De Blanch, MD;  Location: [facility];  Service: General;  Laterality: N/A;      Current Outpatient Medications:    Calcium-Vitamin D-Vitamin K (CHEWABLE CALCIUM PO), Take by mouth., Disp: , Rfl:    levonorgestrel (MIRENA) 20 MCG/DAY IUD, 1 each by Intrauterine route once., Disp: , Rfl:    Objective:   Vitals:   01/13/24 0904  BP: 121/79  Pulse: 88  SpO2: 99%    Physical Exam Constitutional:      Appearance: Normal appearance.  HENT:     Head: Normocephalic and atraumatic.  Cardiovascular:     Rate and Rhythm: Normal rate.     Pulses: Normal pulses.  Pulmonary:     Effort: Pulmonary effort is normal.  Abdominal:     General: There is no distension.     Palpations: Abdomen is soft.     Tenderness: There is no abdominal tenderness.  Musculoskeletal:        General: No swelling or deformity.  Skin:    General: Skin is warm.  Capillary Refill: Capillary refill takes less than 2 seconds.     Coloration: Skin is not jaundiced.     Findings: No bruising or lesion.  Neurological:     Mental Status: She is alert and oriented to person, place, and time.  Psychiatric:        Mood and Affect: Mood normal.        Behavior: Behavior normal.        Judgment: Judgment normal.     Assessment & Plan:  Cannabis abuse  Chronic bilateral thoracic back pain  Panniculitis  Neck pain  The patient is a good candidate for a panniculectomy.  We explained the difference between a panniculectomy and abdominoplasty.  She is interested in losing the weight and then seeing if we can submit to insurance.  I think that is a good idea.  Will plan to see her back in a month and if she is closer to her desired weight we can  submit to insurance.  The plan would be for panniculectomy with liposuction.  Pictures were obtained of the patient and placed in the chart with the patient's or guardian's permission.   Alena Bills Tamario Heal, DO

## 2024-01-24 ENCOUNTER — Ambulatory Visit: Payer: 59 | Admitting: Family Medicine

## 2024-01-24 ENCOUNTER — Ambulatory Visit (INDEPENDENT_AMBULATORY_CARE_PROVIDER_SITE_OTHER): Payer: 59 | Admitting: Family Medicine

## 2024-01-24 ENCOUNTER — Encounter: Payer: Self-pay | Admitting: Family Medicine

## 2024-01-24 VITALS — BP 108/68 | HR 70 | Temp 97.9°F | Resp 18 | Ht 62.0 in | Wt 230.6 lb

## 2024-01-24 DIAGNOSIS — Z7689 Persons encountering health services in other specified circumstances: Secondary | ICD-10-CM

## 2024-01-24 DIAGNOSIS — E66813 Obesity, class 3: Secondary | ICD-10-CM

## 2024-01-24 DIAGNOSIS — G8929 Other chronic pain: Secondary | ICD-10-CM

## 2024-01-24 DIAGNOSIS — Z6841 Body Mass Index (BMI) 40.0 and over, adult: Secondary | ICD-10-CM

## 2024-01-24 DIAGNOSIS — M25561 Pain in right knee: Secondary | ICD-10-CM

## 2024-01-24 NOTE — Progress Notes (Signed)
 Established Patient Office Visit  Subjective   Patient ID: Gail Thomas, female    DOB: [birth year]  Age: 38 y.o. MRN: [id number]  Chief Complaint  Patient presents with   New Patient (Initial Visit)    Pt states having issues with rash in loose skin areas. Plastic surgeron wants patient to lose 30 more pounds and would like discuss weight loss meds    HPI Discussed the use of AI scribe software for clinical note transcription with the patient, who gave verbal consent to proceed.  History of Present Illness   Gail Thomas is a 38 year old female who presents for follow-up after gastric sleeve surgery and to discuss weight loss options.  She recently underwent gastric sleeve surgery and has lost over 60 to 65 pounds. She is considering an abdominoplasty to remove excess skin and aims to lose an additional 30 pounds before proceeding with the surgery. She has modified her diet to include two protein drinks and one solid meal per day to break a weight loss stall, resulting in a recent loss of four pounds.  She is exploring the possibility of using weight loss medication to aid in further weight reduction. She is aware of the insurance challenges associated with these medications.  She has a history of prediabetes, with recent labs in October showing good blood sugar levels but slightly elevated cholesterol.  She experienced swelling in one of her knees, which has since resolved. The knee was swollen on the side and had fluid, but it is no longer swollen. She notes that the knee is not as strong during workouts, particularly when squatting or bearing weight.  She works at [employer] and has three children aged 51, 5, and 4. She is engaged and plans to resize her ring due to weight loss. No current marijuana use.      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   Cancer  of skin of leg 12/02/2015   Benign neoplasm of skin of lower extremity 10/21/2015   Annual physical exam 09/27/2013   Pap smear for cervical cancer screening 09/27/2013   IUD check up 04/12/2011   Obesity 04/12/2011   Marijuana abuse 04/12/2011   Adiposity 04/12/2011   Cannabis abuse 04/12/2011   Past Medical History:  Diagnosis Date   Adiposity    Dermatofibrosarcoma protuberans of right lower extremity    Past Surgical History:  Procedure Laterality Date   CESAREAN SECTION     x3   LAPAROSCOPIC GASTRIC SLEEVE RESECTION N/A 01/17/2023   Procedure: LAPAROSCOPIC SLEEVE GASTRECTOMY;  Surgeon: Rodman Pickle, MD;  Location: [facility];  Service: General;  Laterality: N/A;   LESION REMOVAL Right 01/07/2016   Procedure: RE EXCISION OF RIGHT LEG DERMATOFIBROSARCOMA ;  Surgeon: Peggye Form, DO;  Location: [facility];  Service: Plastics;  Laterality: Right;   UPPER GI ENDOSCOPY N/A 01/17/2023   Procedure: UPPER GI ENDOSCOPY;  Surgeon: Sheliah Hatch, De Blanch, MD;  Location: [facility];  Service: General;  Laterality: N/A;   Social History   Tobacco Use   Smoking status: Never   Smokeless tobacco: Never  Vaping Use   Vaping status: Never Used  Substance Use Topics   Alcohol use: Yes    Comment: socially   Drug use: Not Currently    Frequency: 1.0 times per week    Types: Marijuana   Social  History   Socioeconomic History   Marital status: Significant Other    Spouse name: Not on file   Number of children: 3   Years of education: Not on file   Highest education level: Associate degree: academic program  Occupational History   Not on file  Tobacco Use   Smoking status: Never   Smokeless tobacco: Never  Vaping Use   Vaping status: Never Used  Substance and Sexual Activity   Alcohol use: Yes    Comment: socially   Drug use: Not Currently    Frequency: 1.0 times per week    Types: Marijuana   Sexual activity: Yes    Birth control/protection: Condom,  I.U.D.  Other Topics Concern   Not on file  Social History Narrative   Not on file   Social Drivers of Health   Financial Resource Strain: Low Risk  (08/24/2023)   Overall Financial Resource Strain (CARDIA)    Difficulty of Paying Living Expenses: Not hard at all  Food Insecurity: No Food Insecurity (08/24/2023)   Hunger Vital Sign    Worried About Running Out of Food in the Last Year: Never true    Ran Out of Food in the Last Year: Never true  Transportation Needs: No Transportation Needs (08/24/2023)   PRAPARE - Administrator, Civil Service (Medical): No    Lack of Transportation (Non-Medical): No  Physical Activity: Sufficiently Active (08/24/2023)   Exercise Vital Sign    Days of Exercise per Week: 3 days    Minutes of Exercise per Session: 60 min  Recent Concern: Physical Activity - Insufficiently Active (08/07/2023)   Exercise Vital Sign    Days of Exercise per Week: 2 days    Minutes of Exercise per Session: 60 min  Stress: No Stress Concern Present (08/24/2023)   [employer] of Occupational Health - Occupational Stress Questionnaire    Feeling of Stress : Not at all  Social Connections: Moderately Integrated (08/24/2023)   Social Connection and Isolation Panel [NHANES]    Frequency of Communication with Friends and Family: Three times a week    Frequency of Social Gatherings with Friends and Family: More than three times a week    Attends Religious Services: 1 to 4 times per year    Active Member of [employer] or Organizations: No    Attends Banker Meetings: Not on file    Marital Status: Living with partner  Intimate Partner Violence: Not At Risk (08/24/2023)   Humiliation, Afraid, Rape, and Kick questionnaire    Fear of Current or Ex-Partner: No    Emotionally Abused: No    Physically Abused: No    Sexually Abused: No   Family Status  Relation Name Status   Mother  Alive   Father  Alive   Sister  Alive   Sister  Alive   Brother   Alive   Brother  Alive   MGM Jacquline Deceased  No partnership data on file   Family History  Problem Relation Age of Onset   Diabetes Maternal Grandmother    Cancer - Cervical Maternal Grandmother    No Known Allergies    Review of Systems  Constitutional:  Negative for fever and malaise/fatigue.  HENT:  Negative for congestion.   Eyes:  Negative for blurred vision.  Respiratory:  Negative for shortness of breath.   Cardiovascular:  Negative for chest pain, palpitations and leg swelling.  Gastrointestinal:  Negative for abdominal pain, blood in stool and nausea.  Genitourinary:  Negative for dysuria and frequency.  Musculoskeletal:  Negative for falls.  Skin:  Negative for rash.  Neurological:  Negative for dizziness, loss of consciousness and headaches.  Endo/Heme/Allergies:  Negative for environmental allergies.  Psychiatric/Behavioral:  Negative for depression. The patient is not nervous/anxious.       Objective:     BP 108/68 (BP Location: Left Arm, Patient Position: Sitting, Cuff Size: Large)   Pulse 70   Temp 97.9 F (36.6 C) (Oral)   Resp 18   Ht 5\' 2"  (1.575 m)   Wt 230 lb 9.6 oz (104.6 kg)   SpO2 99%   BMI 42.18 kg/m  BP Readings from Last 3 Encounters:  01/24/24 108/68  01/13/24 121/79  10/12/23 115/67   Wt Readings from Last 3 Encounters:  01/24/24 230 lb 9.6 oz (104.6 kg)  10/12/23 230 lb 12.8 oz (104.7 kg)  08/24/23 241 lb 3.2 oz (109.4 kg)   SpO2 Readings from Last 3 Encounters:  01/24/24 99%  01/13/24 99%  10/11/23 100%      Physical Exam Vitals and nursing note reviewed.  Constitutional:      General: She is not in acute distress.    Appearance: Normal appearance. She is well-developed.  HENT:     Head: Normocephalic and atraumatic.  Eyes:     General: No scleral icterus.       Right eye: No discharge.        Left eye: No discharge.  Cardiovascular:     Rate and Rhythm: Normal rate and regular rhythm.     Heart sounds: No  murmur heard. Pulmonary:     Effort: Pulmonary effort is normal. No respiratory distress.     Breath sounds: Normal breath sounds.  Musculoskeletal:        General: Normal range of motion.     Cervical back: Normal range of motion and neck supple.     Right lower leg: No edema.     Left lower leg: No edema.  Skin:    General: Skin is warm and dry.  Neurological:     Mental Status: She is alert and oriented to person, place, and time.  Psychiatric:        Mood and Affect: Mood normal.        Behavior: Behavior normal.        Thought Content: Thought content normal.        Judgment: Judgment normal.      No results found for any visits on 01/24/24.  Last CBC Lab Results  Component Value Date   WBC 6.0 08/24/2023   HGB 13.6 08/24/2023   HCT 41.3 08/24/2023   MCV 91 08/24/2023   MCH 30.1 08/24/2023   RDW 12.4 08/24/2023   PLT 357 08/24/2023   Last metabolic panel Lab Results  Component Value Date   GLUCOSE 79 08/24/2023   NA 139 08/24/2023   K 4.4 08/24/2023   CL 105 08/24/2023   CO2 25 08/24/2023   BUN 9 08/24/2023   CREATININE 0.80 08/24/2023   EGFR 98 08/24/2023   CALCIUM 9.5 08/24/2023   PROT 6.3 08/24/2023   ALBUMIN 4.0 08/24/2023   LABGLOB 2.3 08/24/2023   BILITOT 0.3 08/24/2023   ALKPHOS 112 08/24/2023   AST 14 08/24/2023   ALT 15 08/24/2023   ANIONGAP 6 01/10/2023   Last lipids Lab Results  Component Value Date   CHOL 196 08/24/2023   HDL 51 08/24/2023   LDLCALC 128 (H) 08/24/2023  TRIG 96 08/24/2023   CHOLHDL 3.8 08/24/2023   Last hemoglobin A1c Lab Results  Component Value Date   HGBA1C 5.8 (H) 08/24/2023   Last thyroid functions Lab Results  Component Value Date   TSH 1.840 08/24/2023   Last vitamin D No results found for: "25OHVITD2", "25OHVITD3", "VD25OH" Last vitamin B12 and Folate No results found for: "VITAMINB12", "FOLATE"    The ASCVD Risk score (Arnett DK, et al., 2019) failed to calculate for the following reasons:    The 2019 ASCVD risk score is only valid for ages 57 to 70    Assessment & Plan:   Problem List Items Addressed This Visit       Unprioritized   Adiposity - Primary  Assessment and Plan    Post-bariatric surgery weight management   Following gastric sleeve surgery, she has lost over 60-65 pounds. She is considering abdominoplasty to remove excess skin but needs to lose an additional 30 pounds as advised by her surgeon. She is exploring weight loss medications, with insurance coverage being a concern. Zepbound is noted to be effective for weight loss, with studies showing improved outcomes compared to [facility]. Cash pay options are available if insurance does not cover it. Phentermine is not recommended due to potential side effects such as increased blood pressure, heart rate, and risk of heart attack, and it is only indicated for short-term use. Check with the insurance provider or HR department regarding coverage for weight loss medications. Consider Zepbound for weight loss if covered by insurance or opt for cash pay options. Contact the provider via MyChart once insurance information is obtained.  Knee swelling   She reported previous swelling in one knee, which has since resolved. The swelling was possibly due to fluid accumulation or a sore meniscus, which may have been a minor tear. She is engaging in physical activity and notes improvement in knee strength. Continue physical activity to strengthen the knee. Consider physical therapy if knee issues persist.  General Health Maintenance   She does not receive influenza vaccinations due to experiencing illness post-vaccination. She receives Pap smears from her OB/GYN, Dr. Nigel Berthold. Continue regular Pap smears with OB/GYN.        No follow-ups on file.    Donato Schultz, DO

## 2024-01-25 ENCOUNTER — Encounter: Payer: Self-pay | Admitting: Family Medicine

## 2024-01-25 ENCOUNTER — Other Ambulatory Visit: Payer: Self-pay | Admitting: Family Medicine

## 2024-01-25 MED ORDER — WEGOVY 0.25 MG/0.5ML ~~LOC~~ SOAJ: 0.2500 mg | SUBCUTANEOUS | 0 refills | Status: AC

## 2024-02-08 ENCOUNTER — Encounter: Payer: Self-pay | Admitting: Family Medicine

## 2024-02-08 NOTE — Patient Instructions (Signed)

## 2024-03-26 ENCOUNTER — Telehealth: Payer: Self-pay | Admitting: Student

## 2024-03-26 NOTE — Telephone Encounter (Signed)
 called lvmail 03-26-24, CE out of office 5-30 Friday in [place], pt can move to 5-29 thurs or to diff day

## 2024-04-06 ENCOUNTER — Ambulatory Visit: Admitting: Student

## 2024-04-20 ENCOUNTER — Ambulatory Visit: Admitting: Student

## 2024-04-20 VITALS — BP 109/73 | HR 77 | Ht 62.0 in | Wt 210.4 lb

## 2024-04-20 DIAGNOSIS — M793 Panniculitis, unspecified: Secondary | ICD-10-CM

## 2024-04-20 NOTE — Progress Notes (Signed)
   Referring Provider Wilhelmena Hanson, FNP [address] [place],  [place] [address]   CC:  Chief Complaint  Patient presents with   Follow-up      Gail Thomas is an 38 y.o. female.  HPI: Patient is a 38 year old female who presents to the clinic today for further follow-up about possible panniculectomy.  She saw Dr. Orin Birk for initial consult on 01/13/2024.  At this visit, patient weighed 234 pounds.  She reported that she had lost 65 pounds over the year.  Patient states that she had a gastric sleeve in March 2024.  Patient also reported she had a hernia repair and 3 C-sections.  She stated that she healed from those without any difficulty.  Patient reported that she was wanting to lose maybe about 30 more pounds to be at her desired weight.  Patient attributed some back pain she was having due to to her pannus.  She also stated that she was having skin breakdown and odor in her skin folds.  Patient was found to be good candidate for panniculectomy.  Difference between panniculectomy and abdominoplasty was discussed.  Today, patient reports she is doing well.  She states that she has lost 20 pounds and would like to move forward with surgery.  Patient states that she would like to move forward with a panniculectomy with an upper abdominal add-on.  Patient states that she understands the upper abdominal add-on part will not be covered by insurance.  Patient denies any recent fevers, chills or changes in her health.  She states that she has not smoked anything since she saw Dr. Orin Birk back in March.  Patient denies any history of cardiac disease.  She denies taking any blood thinners.  Review of Systems General: Denies any recent fevers, chills or changes in her health  Physical Exam    04/20/2024   10:48 AM 01/24/2024    2:19 PM 01/13/2024    9:04 AM  Vitals with BMI  Height 5' 2 5' 2 5' 2  Weight 210 lbs 6 oz 230 lbs 10 oz   BMI 38.47 42.17   Systolic 109 108 657  Diastolic 73  68 79  Pulse 77 70 88    General:  No acute distress,  Alert and oriented, Non-Toxic, Normal speech and affect Pulm: Unlabored breathing no respiratory distress MSK: Ambulatory Psych: Normal mood, normal behavior  Assessment/Plan  Panniculitis - Plan: Ambulatory Referral For Surgery Scheduling   Discussed with patient we will go ahead and submit to her insurance company for the panniculectomy portion of her surgery.  I discussed with her that this could take up to 6 weeks.  She expressed understanding.  Will confirm with Dr. Orin Birk she is okay with patient moving forward with upper abdominal add-on as it seems upper abdominal add-on was discussed at the consult appointment.  I instructed the patient to call if she has any questions or concerns about anything.  Gail Thomas 04/20/2024, 2:36 PM

## 2024-10-12 ENCOUNTER — Encounter (HOSPITAL_BASED_OUTPATIENT_CLINIC_OR_DEPARTMENT_OTHER): Payer: 59 | Admitting: Family Medicine
